# Patient Record
Sex: Male | Born: 1950 | Race: White | Hispanic: No | Marital: Married | Smoking: Former smoker
Health system: Southern US, Community
[De-identification: ages and names within clinical notes are randomized; demographics above are authoritative.]

## PROBLEM LIST (undated history)

## (undated) DIAGNOSIS — I2510 Atherosclerotic heart disease of native coronary artery without angina pectoris: Secondary | ICD-10-CM

## (undated) DIAGNOSIS — I219 Acute myocardial infarction, unspecified: Secondary | ICD-10-CM

## (undated) DIAGNOSIS — T8859XA Other complications of anesthesia, initial encounter: Secondary | ICD-10-CM

## (undated) DIAGNOSIS — I10 Essential (primary) hypertension: Secondary | ICD-10-CM

## (undated) DIAGNOSIS — T4145XA Adverse effect of unspecified anesthetic, initial encounter: Secondary | ICD-10-CM

## (undated) HISTORY — PX: KNEE SURGERY: SHX244

## (undated) HISTORY — PX: APPENDECTOMY: SHX54

## (undated) HISTORY — PX: HERNIA REPAIR: SHX51

## (undated) HISTORY — PX: COLON SURGERY: SHX602

## (undated) HISTORY — PX: CATARACT EXTRACTION: SUR2

## (undated) HISTORY — PX: BACK SURGERY: SHX140

## (undated) HISTORY — PX: OTHER SURGICAL HISTORY: SHX169

---

## 2003-01-25 ENCOUNTER — Ambulatory Visit (HOSPITAL_COMMUNITY): Admission: RE | Admit: 2003-01-25 | Discharge: 2003-01-26 | Payer: Self-pay | Admitting: Ophthalmology

## 2007-08-17 ENCOUNTER — Ambulatory Visit (HOSPITAL_COMMUNITY): Admission: RE | Admit: 2007-08-17 | Discharge: 2007-08-18 | Payer: Self-pay | Admitting: Ophthalmology

## 2011-05-13 NOTE — Op Note (Signed)
[name], [name]               ACCOUNT NO.:  [id number]   MEDICAL RECORD NO.:  [id number]          PATIENT TYPE:  AMB   LOCATION:  SDS                          FACILITY:  [facility]   PHYSICIAN:  John D. Ashley Royalty, M.D. DATE OF BIRTH:  August 21, 1951   DATE OF PROCEDURE:  08/17/2007  DATE OF DISCHARGE:                               OPERATIVE REPORT   ADMISSION DIAGNOSIS:  Rhegmatogenous retinal detachment, retained lens  material in the vitreous left eye.   PROCEDURE:  Scleral buckle with 25 gauge pars plana vitrectomy to repair  complex retinal detachment.  Retinal photocoagulation and capsulectomy  were performed as well.   SURGEON:  Beulah Gandy. Ashley Royalty, M.D.   ASSISTANT:  Bryan Lemma. Lundquist, P.A.   ANESTHESIA:  General.   DETAILS:  Usual prep and drape.  A 360-degree limbal peritomy.  Isolation of four rectus muscles on 2-0 silk.  Scleral dissection from 3  o'clock around to 2 o'clock to admit a #279 intrascleral implant.  Diathermy placed in the bed.  The #279 implant placed, two sutures per  quadrant, for a total of eight scleral sutures were placed in the  scleral flaps.  508G radial segment was placed beneath the break at 9  o'clock.  Perforation site was chosen in the in the bed at 9 o'clock  after scleral incision and diathermy.  The choroid was punctured with a  blunt needle.  A moderate amount of clear colorless retinal fluid came  forth.  Once the fluid stopped. the radial 508G segment was placed in  this area against the globe.  Scleral flaps were closed.  A 240 band was  placed around the eye with a 270 sleeve at 4 o'clock.  Indirect  ophthalmoscopy showed the retina to be lying nicely on the scleral  buckle, with minimal subretinal fluid remaining.  The indirect  ophthalmoscope laser was moved into place, and 720 burns were placed  around the retinal periphery and around the retinal break on the scleral  buckle.  The power was between 500-660 milliwatts, 1000 microns  each,  and 0.1 seconds each.  The band was adjusted to a proper indentation of  the globe.  25-gauge trocars were placed at 2 o'clock and 4 o'clock. The  buckle flaps were closed, and the scleral sutures were knotted.  The  band was adjusted and trimmed.  The buckle was adjusted and trimmed.  A  pars plana vitrectomy was begun just behind the pseudophakos. Retained  lens material was seen between 9 o'clock and 12 o'clock in the posterior  chamber of the eye.  This material was carefully removed under low  suction and rapid cutting with a 25 gauge cutter.  A 25-gauge infusion  was placed at 4 o'clock.  The vitrectomy was carried posteriorly until  all vitreous fragments were removed.  The instruments were removed from  the eye.  The trocars were removed from the eye.  They were tested and  found to be tight.  The conjunctiva was reposited with 7-0 chromic  suture.  Polymyxin and gentamicin were irrigated into Tenon's space.  Marcaine was  injected around the globe for postop pain.  Decadron 10 mg  was injected into the lower subconjunctival space.  Indirect  ophthalmoscopy showed the retina to be lying nicely on the scleral  buckle for 360 degrees.  TobraDex ophthalmic ointment and a patch and  shield were placed.  Closing pressure was 21 with a Baer keratometer.   COMPLICATIONS:  None.   DURATION:  1 hour 45 minutes.   The patient was awakened and taken to recovery in satisfactory  condition.   FINAL DIAGNOSIS:  Complex retinal detachment left eye.      Beulah Gandy. Ashley Royalty, M.D.  Electronically Signed     JDM/MEDQ  D:  08/17/2007  T:  08/18/2007  Job:  [id number]

## 2011-05-16 NOTE — Op Note (Signed)
NAME:  [name], CRUTCHFIELD                         ACCOUNT NO.:  [id number]   MEDICAL RECORD NO.:  [id number]                   PATIENT TYPE:  OIB   LOCATION:  2864                                 FACILITY:  [facility]   PHYSICIAN:  Beulah Gandy. Ashley Royalty, M.D.              DATE OF BIRTH:  1951/07/08   DATE OF PROCEDURE:  01/25/2003  DATE OF DISCHARGE:                                 OPERATIVE REPORT   PREOPERATIVE DIAGNOSIS:  Rhegmatogenous retinal detachment and vitreous  hemorrhage, right eye.   POSTOPERATIVE DIAGNOSIS:   OPERATION PERFORMED:  1. Scleral buckle, pars plana vitrectomy, right eye.  2. Gas-fluid exchange, right eye.  3. Retina photocoagulation, right eye.  4. Perfluoropropane injection, right eye.   SURGEON:  Beulah Gandy. Ashley Royalty, M.D.   ASSISTANT:  Ruthann Cancer   ANESTHESIA:  General.   DETAILS OF OPERATION:  Usual prep and drape.  Three-hundred 360-degree  limbal peritomy.  Isolation of four rectus muscles on 2-0 silk.  Localization of breaks at 9 and 11 o'clock.  Scleral dissection from 8  o'clock to 2 o'clock to admit a #279 intrascleral implant.  Additional  posterior dissection was carried out at 11 o'clock beneath the break.  Diathermy was placed in the bed and two sutures per quadrant were placed in  the scleral flaps for a total four scleral sutures.  A 240 band was placed  around the eye with a belt loupe at 3 o'clock and 5 o'clock.  The 279  implant was placed against the globe and a 508G radial segment was placed  beneath the break at 11 o'clock.   Once the elements were placed the pars plana vitrectomy was begun.  A 4 mm  infusion port was anchored into place at 8 o'clock.  The sclerotomies were  placed at 10 and 2 o'clock respectively.  Contact lens ring  was anchored  into place at 6 and 12 o'clock.  Methylcellulose was placed on the cornea  and the flap contact lens was placed.  A core vitrectomy was carried out  removing blood from the vitreous cavity and  blood was vacuumed from the  surface of the retina.  A stream of blood was seen coming down from the  retinal break.   The endolaser was positioned in the eye and the blood vessel was cauterized.  It shriveled up beneath the laser burns.  The bleeding stopped immediately.  Two-hundred-seventy-nine burns were placed in this fashion with a power of  1,000 milliwatts, 1,000 microns each and 0.5 seconds each.  The pars plana  vitrectomy was continued for 360 degrees down to the vitreous base where all  blood and vitreous were removed.  The scleral depression was used for  optimum viewing and a 30 degree prismatic lens was used.   Once this was accomplished the scleral flaps were closed securely with the  proper conjunctival indentation of the globe.  The band  was adjusted and  trimmed.  The scleral sutures were knotted and trimmed.  The 508G segment  was located perfectly beneath the break.   Perfluoropropane was prepared in 18% concentration.  Approximately 2 mL was  injected into the vitreous cavity for about a 40% vitreous cavity fill with  18% gas.   The instruments were removed from the eye and 9-0 nylon was used to close  the sclerotic sites.  The sites were tested and found to be tight.  The  conjunctiva was reposited with 7-0 chromic suture.  Polymyxin and gentamicin  were irrigated into Tenon's space.  Atropine solution was applied.  Decadron  was injected into the lower subconjunctival space.   The indirect ophthalmoscope laser was moved into place and additional laser  was placed around the break at 9 o'clock and on the scleral buckle.  Two-  hundred-seventy-six burns were placed with a power of 1,000 milliwatts each,  size 1,000 microns each and 0.05 second.  The closing tension was 10 mg per  tonometer, Polysporin, a patch and shield were placed.   The patient was awakened and taken to recovery in satisfactory condition.                                                  Beulah Gandy. Ashley Royalty, M.D.    JDM/MEDQ  D:  01/25/2003  T:  01/25/2003  Job:  [id number]

## 2011-06-30 ENCOUNTER — Other Ambulatory Visit (HOSPITAL_COMMUNITY): Payer: Self-pay | Admitting: Surgery

## 2011-06-30 DIAGNOSIS — K432 Incisional hernia without obstruction or gangrene: Secondary | ICD-10-CM

## 2011-07-07 ENCOUNTER — Other Ambulatory Visit (HOSPITAL_COMMUNITY): Payer: Self-pay

## 2011-07-09 ENCOUNTER — Ambulatory Visit (HOSPITAL_COMMUNITY)
Admission: RE | Admit: 2011-07-09 | Discharge: 2011-07-09 | Disposition: A | Discharge status: Home / Self Care | Payer: PRIVATE HEALTH INSURANCE | Source: Ambulatory Visit | Attending: Surgery | Admitting: Surgery

## 2011-07-09 DIAGNOSIS — K5730 Diverticulosis of large intestine without perforation or abscess without bleeding: Secondary | ICD-10-CM | POA: Insufficient documentation

## 2011-07-09 DIAGNOSIS — K432 Incisional hernia without obstruction or gangrene: Secondary | ICD-10-CM | POA: Insufficient documentation

## 2011-07-09 DIAGNOSIS — R109 Unspecified abdominal pain: Secondary | ICD-10-CM | POA: Insufficient documentation

## 2011-07-10 MED ORDER — IOHEXOL 300 MG/ML  SOLN
100.0000 mL | Freq: Once | INTRAMUSCULAR | Status: AC | PRN
  Administered 2011-07-10: 100 mL via INTRAVENOUS

## 2011-10-10 LAB — BASIC METABOLIC PANEL
GFR calc non Af Amer: >60
Potassium: 4.7
Sodium: 140

## 2011-10-10 LAB — CBC
HCT: 42.8
Hemoglobin: 14.6
Platelets: 203
WBC: 7.1

## 2012-03-10 ENCOUNTER — Encounter (HOSPITAL_BASED_OUTPATIENT_CLINIC_OR_DEPARTMENT_OTHER): Payer: Self-pay

## 2012-03-10 ENCOUNTER — Emergency Department (HOSPITAL_BASED_OUTPATIENT_CLINIC_OR_DEPARTMENT_OTHER)
Admission: EM | Admit: 2012-03-10 | Discharge: 2012-03-10 | Disposition: A | Discharge status: Home / Self Care | Payer: BC Managed Care – PPO | Attending: Emergency Medicine | Admitting: Emergency Medicine

## 2012-03-10 DIAGNOSIS — Y92009 Unspecified place in unspecified non-institutional (private) residence as the place of occurrence of the external cause: Secondary | ICD-10-CM | POA: Insufficient documentation

## 2012-03-10 DIAGNOSIS — Z9089 Acquired absence of other organs: Secondary | ICD-10-CM | POA: Insufficient documentation

## 2012-03-10 DIAGNOSIS — W260XXA Contact with knife, initial encounter: Secondary | ICD-10-CM | POA: Insufficient documentation

## 2012-03-10 DIAGNOSIS — Z79899 Other long term (current) drug therapy: Secondary | ICD-10-CM | POA: Insufficient documentation

## 2012-03-10 DIAGNOSIS — Z9861 Coronary angioplasty status: Secondary | ICD-10-CM | POA: Insufficient documentation

## 2012-03-10 DIAGNOSIS — S61219A Laceration without foreign body of unspecified finger without damage to nail, initial encounter: Secondary | ICD-10-CM

## 2012-03-10 DIAGNOSIS — S61209A Unspecified open wound of unspecified finger without damage to nail, initial encounter: Secondary | ICD-10-CM | POA: Insufficient documentation

## 2012-03-10 DIAGNOSIS — Z7982 Long term (current) use of aspirin: Secondary | ICD-10-CM | POA: Insufficient documentation

## 2012-03-10 DIAGNOSIS — I10 Essential (primary) hypertension: Secondary | ICD-10-CM | POA: Insufficient documentation

## 2012-03-10 DIAGNOSIS — W261XXA Contact with sword or dagger, initial encounter: Secondary | ICD-10-CM | POA: Insufficient documentation

## 2012-03-10 HISTORY — DX: Essential (primary) hypertension: I10

## 2012-03-10 MED ORDER — LIDOCAINE HCL (PF) 1 % IJ SOLN
5.0000 mL | Freq: Once | INTRAMUSCULAR | Status: AC
  Administered 2012-03-10: 5 mL via INTRADERMAL
  Filled 2012-03-10: qty 5

## 2012-03-10 NOTE — ED Notes (Signed)
Cut left middle finger on pocket knife 30 min PTA

## 2012-03-10 NOTE — ED Provider Notes (Signed)
History     CSN: [id number]  Arrival date & time 03/10/12  8657   First MD Initiated Contact with Patient 03/10/12 2013      Chief Complaint  Patient presents with  . Finger Injury    (Consider location/radiation/quality/duration/timing/severity/associated sxs/prior treatment) Patient is a 61 y.o. male presenting with hand injury. The history is provided by the patient. No language interpreter was used.  Hand Injury  The incident occurred 2 days ago. The incident occurred at home. There was no injury mechanism. The pain is present in the left fingers. The quality of the pain is described as aching. The pain is at a severity of 5/10. The pain is moderate. The pain has been constant since the incident. He reports no foreign bodies present. The symptoms are aggravated by movement. He has tried nothing for the symptoms.  Pt complains that he cut her finger with a pocket knife.    Past Medical History  Diagnosis Date  . Diverticula of colon   . Hypertension     Past Surgical History  Procedure Date  . Cardiac stent   . Colon surgery   . Hernia repair   . Back surgery   . Cataract extraction   . Knee surgery   . Appendectomy     No family history on file.  History  Substance Use Topics  . Smoking status: Never Smoker   . Smokeless tobacco: Not on file  . Alcohol Use: Yes      Review of Systems  Skin: Positive for wound.  All other systems reviewed and are negative.    Allergies  Review of patient's allergies indicates no known allergies.  Home Medications   Current Outpatient Rx  Name Route Sig Dispense Refill  . ASPIRIN 81 MG PO TABS Oral Take 81 mg by mouth daily.    . ENALAPRIL MALEATE 20 MG PO TABS Oral Take 20 mg by mouth daily.    Marland Kitchen EZETIMIBE 10 MG PO TABS Oral Take 10 mg by mouth daily.    Marland Kitchen METOPROLOL SUCCINATE ER 25 MG PO TB24 Oral Take 25 mg by mouth daily. Patient takes 12.5 milligrams of this medication.    Marland Kitchen NIACIN 500 MG PO TABS Oral Take 500  mg by mouth daily with breakfast.    . PRAVASTATIN SODIUM 80 MG PO TABS Oral Take 80 mg by mouth daily.      BP 137/75  Pulse 72  Temp(Src) 98.4 F (36.9 C) (Oral)  Resp 18  Ht 6\' 1"  (1.854 m)  Wt 224 lb (101.606 kg)  BMI 29.55 kg/m2  SpO2 98%  Physical Exam  Nursing note and vitals reviewed. Constitutional: He is oriented to person, place, and time. He appears well-developed and well-nourished.  HENT:  Head: Normocephalic.  Musculoskeletal: He exhibits tenderness.       5mm laceration dital aspect of left middle finger  Neurological: He is alert and oriented to person, place, and time.  Skin: Skin is warm.  Psychiatric: He has a normal mood and affect.    ED Course  LACERATION REPAIR Date/Time: 03/10/2012 9:03 PM Performed by: Elson Areas Authorized by: Elson Areas Consent: Verbal consent obtained. Time out: Immediately prior to procedure a "time out" was called to verify the correct patient, procedure, equipment, support staff and site/side marked as required. Laceration length: 0.5 cm Foreign bodies: no foreign bodies Tendon involvement: none Anesthesia: local infiltration Preparation: Patient was prepped and draped in the usual sterile fashion. Skin closure: glue  Patient tolerance: Patient tolerated the procedure well with no immediate complications.   (including critical care time)  Labs Reviewed - No data to display No results found.   1. Laceration of finger       MDM  Pt placed in splint to protect.       Lonia Skinner [place], [place] 03/10/12 2104

## 2012-03-10 NOTE — Discharge Instructions (Signed)
Tissue Adhesive Wound Care A wound can be repaired by using tissue adhesive. Tissue adhesive holds the skin together and allows faster healing. It forms a strong bond on the skin in about 1 minute and reaches its full strength in about 2 or 3 minutes. The adhesive disappears naturally while healing. Follow up is required if your caregiver wants to recheck for infection and to make sure your wound is healing properly.  You may need a tetanus shot if:  You cannot remember when you had your last tetanus shot.   You have never had a tetanus shot.   The injury broke your skin.  If you got a tetanus shot, your arm may swell, get red, and feel warm to the touch. This is common and not a problem. If you need a tetanus shot and you choose not to have one, there is a rare chance of getting tetanus. Sickness from tetanus can be serious. HOME CARE INSTRUCTIONS   Only take over-the-counter or prescription medicines for pain, discomfort, or fever as directed by your caregiver.   Showers are allowed. Do not soak the area containing the tissue adhesive. Do not take baths, swim, or use hot tubs. Do not use any soaps or ointments on the wound until it has healed.   If a bandage (dressing) has been applied, follow your caregiver's instructions for how often to change the dressing.   Keep the dressing dry if one has been applied.   Do not scratch, pick, or rub the adhesive.   Do not place tape over the adhesive. The adhesive could come off when pulling the tape off.   Protect the wound from further injury until it is healed.   Protect the wound from sun and tanning bed exposure while it is healing and for several weeks after healing.   Keep all follow-up appointments as directed by your caregiver.  SEEK IMMEDIATE MEDICAL CARE IF:   Your wound becomes red, swollen, hot, or tender.   You have increasing pain in the wound.   You have a red streak that goes away from the wound.   You have pus coming  from the wound.   You have a fever.   You have shaking chills.   There is a bad smell coming from the wound.   The wound or adhesive breaks open.  MAKE SURE YOU:   Understand these instructions.   Will watch your condition.   Will get help right away if you are not doing well or get worse.  Document Released: 06/10/2001 Document Revised: 12/04/2011 Document Reviewed: 04/20/2011 [employer] Patient Information 2012 [employer]. 

## 2012-03-10 NOTE — ED Provider Notes (Signed)
Medical screening examination/treatment/procedure(s) were performed by non-physician practitioner and as supervising physician I was immediately available for consultation/collaboration.  Ethelda Chick, MD 03/10/12 [phone]

## 2015-04-18 ENCOUNTER — Encounter (HOSPITAL_COMMUNITY): Payer: Self-pay | Admitting: *Deleted

## 2015-04-18 ENCOUNTER — Observation Stay (HOSPITAL_COMMUNITY)
Admission: EM | Admit: 2015-04-18 | Discharge: 2015-04-19 | Disposition: A | Discharge status: Home / Self Care | Payer: BLUE CROSS/BLUE SHIELD | Attending: Family Medicine | Admitting: Family Medicine

## 2015-04-18 ENCOUNTER — Emergency Department (HOSPITAL_COMMUNITY): Payer: BLUE CROSS/BLUE SHIELD

## 2015-04-18 DIAGNOSIS — Z79899 Other long term (current) drug therapy: Secondary | ICD-10-CM | POA: Insufficient documentation

## 2015-04-18 DIAGNOSIS — I10 Essential (primary) hypertension: Secondary | ICD-10-CM | POA: Diagnosis not present

## 2015-04-18 DIAGNOSIS — E780 Pure hypercholesterolemia: Secondary | ICD-10-CM | POA: Insufficient documentation

## 2015-04-18 DIAGNOSIS — R072 Precordial pain: Secondary | ICD-10-CM | POA: Diagnosis not present

## 2015-04-18 DIAGNOSIS — R61 Generalized hyperhidrosis: Secondary | ICD-10-CM | POA: Insufficient documentation

## 2015-04-18 DIAGNOSIS — R110 Nausea: Secondary | ICD-10-CM | POA: Diagnosis not present

## 2015-04-18 DIAGNOSIS — Z8719 Personal history of other diseases of the digestive system: Secondary | ICD-10-CM | POA: Insufficient documentation

## 2015-04-18 DIAGNOSIS — R001 Bradycardia, unspecified: Secondary | ICD-10-CM | POA: Insufficient documentation

## 2015-04-18 DIAGNOSIS — E785 Hyperlipidemia, unspecified: Secondary | ICD-10-CM | POA: Insufficient documentation

## 2015-04-18 DIAGNOSIS — I2510 Atherosclerotic heart disease of native coronary artery without angina pectoris: Secondary | ICD-10-CM | POA: Insufficient documentation

## 2015-04-18 DIAGNOSIS — E039 Hypothyroidism, unspecified: Secondary | ICD-10-CM | POA: Diagnosis not present

## 2015-04-18 DIAGNOSIS — Z9861 Coronary angioplasty status: Secondary | ICD-10-CM | POA: Diagnosis not present

## 2015-04-18 DIAGNOSIS — R079 Chest pain, unspecified: Principal | ICD-10-CM | POA: Diagnosis present

## 2015-04-18 DIAGNOSIS — Z7982 Long term (current) use of aspirin: Secondary | ICD-10-CM | POA: Insufficient documentation

## 2015-04-18 HISTORY — DX: Atherosclerotic heart disease of native coronary artery without angina pectoris: I25.10

## 2015-04-18 HISTORY — DX: Acute myocardial infarction, unspecified: I21.9

## 2015-04-18 HISTORY — DX: Other complications of anesthesia, initial encounter: T88.59XA

## 2015-04-18 HISTORY — DX: Adverse effect of unspecified anesthetic, initial encounter: T41.45XA

## 2015-04-18 LAB — CBC WITH DIFFERENTIAL/PLATELET
BASOS PCT: 0 % (ref 0–1)
Basophils Absolute: 0 10*3/uL (ref 0.0–0.1)
EOS ABS: 0.2 10*3/uL (ref 0.0–0.7)
Eosinophils Relative: 3 % (ref 0–5)
HCT: 42.6 % (ref 39.0–52.0)
HEMOGLOBIN: 14.2 g/dL (ref 13.0–17.0)
LYMPHS ABS: 2.3 10*3/uL (ref 0.7–4.0)
LYMPHS PCT: 29 % (ref 12–46)
MCH: 30.4 pg (ref 26.0–34.0)
MCHC: 33.3 g/dL (ref 30.0–36.0)
MCV: 91.2 fL (ref 78.0–100.0)
MONOS PCT: 7 % (ref 3–12)
Monocytes Absolute: 0.6 10*3/uL (ref 0.1–1.0)
NEUTROS ABS: 4.8 10*3/uL (ref 1.7–7.7)
NEUTROS PCT: 61 % (ref 43–77)
PLATELETS: 202 10*3/uL (ref 150–400)
RBC: 4.67 MIL/uL (ref 4.22–5.81)
RDW: 12.9 % (ref 11.5–15.5)
WBC: 7.9 10*3/uL (ref 4.0–10.5)

## 2015-04-18 LAB — BASIC METABOLIC PANEL
ANION GAP: 8 (ref 5–15)
BUN: 18 mg/dL (ref 6–23)
CHLORIDE: 104 mmol/L (ref 96–112)
CO2: 25 mmol/L (ref 19–32)
Calcium: 8.9 mg/dL (ref 8.4–10.5)
Creatinine, Ser: 0.98 mg/dL (ref 0.50–1.35)
GFR calc non Af Amer: 86 mL/min — ABNORMAL LOW (ref >90)
Glucose, Bld: 98 mg/dL (ref 70–99)
POTASSIUM: 5 mmol/L (ref 3.5–5.1)
SODIUM: 137 mmol/L (ref 135–145)

## 2015-04-18 LAB — TROPONIN I

## 2015-04-18 MED ORDER — ENALAPRIL MALEATE 5 MG PO TABS
10.0000 mg | ORAL_TABLET | Freq: Every day | ORAL | Status: DC
  Filled 2015-04-18: qty 2

## 2015-04-18 MED ORDER — ACETAMINOPHEN 325 MG PO TABS
650.0000 mg | ORAL_TABLET | ORAL | Status: DC | PRN
Start: 2015-04-18 — End: 2015-04-19

## 2015-04-18 MED ORDER — METOPROLOL SUCCINATE ER 25 MG PO TB24
12.5000 mg | ORAL_TABLET | Freq: Every day | ORAL | Status: DC
  Filled 2015-04-18: qty 1

## 2015-04-18 MED ORDER — ACETAMINOPHEN 325 MG PO TABS
650.0000 mg | ORAL_TABLET | Freq: Once | ORAL | Status: AC
Start: 2015-04-18 — End: 2015-04-18
  Administered 2015-04-18: 650 mg via ORAL
  Filled 2015-04-18: qty 2

## 2015-04-18 MED ORDER — ONDANSETRON HCL 4 MG/2ML IJ SOLN: 4.0000 mg | Freq: Four times a day (QID) | INTRAMUSCULAR | Status: DC | PRN

## 2015-04-18 MED ORDER — ASPIRIN EC 81 MG PO TBEC
81.0000 mg | DELAYED_RELEASE_TABLET | Freq: Every day | ORAL | Status: DC
  Administered 2015-04-19: 81 mg via ORAL
  Filled 2015-04-18 (×2): qty 1

## 2015-04-18 MED ORDER — LEVOTHYROXINE SODIUM 75 MCG PO TABS
75.0000 ug | ORAL_TABLET | Freq: Every day | ORAL | Status: DC
  Administered 2015-04-19: 75 ug via ORAL
  Filled 2015-04-18: qty 1

## 2015-04-18 MED ORDER — POLYETHYLENE GLYCOL 3350 17 G PO PACK: 17.0000 g | PACK | Freq: Every day | ORAL | Status: DC

## 2015-04-18 MED ORDER — PRAVASTATIN SODIUM 40 MG PO TABS
80.0000 mg | ORAL_TABLET | Freq: Every day | ORAL | Status: DC
  Filled 2015-04-18: qty 2

## 2015-04-18 MED ORDER — HEPARIN SODIUM (PORCINE) 5000 UNIT/ML IJ SOLN: 5000.0000 [IU] | Freq: Three times a day (TID) | INTRAMUSCULAR | Status: DC

## 2015-04-18 NOTE — ED Notes (Signed)
Appointment at 2115

## 2015-04-18 NOTE — ED Provider Notes (Signed)
CSN: [id number]     Arrival date & time 04/18/15  1748 History   First MD Initiated Contact with Patient 04/18/15 1750     Chief Complaint  Patient presents with  . Chest Pain   (Consider location/radiation/quality/duration/timing/severity/associated sxs/prior Treatment) Patient is a 64 y.o. male presenting with chest pain. The history is provided by the patient. No language interpreter was used.  Chest Pain Pain location:  Substernal area Pain quality: pressure   Pain radiates to:  Does not radiate Pain radiates to the back: no   Pain severity:  Moderate Onset quality:  Gradual Timing:  Constant Progression:  Resolved Chronicity:  New Context comment:  After exercise Relieved by:  Nitroglycerin and aspirin Worsened by:  Nothing tried Ineffective treatments:  None tried Associated symptoms: diaphoresis and nausea   Associated symptoms: no abdominal pain, no altered mental status, no cough, no dizziness, no fatigue, no fever, no headache, no numbness, no palpitations, no shortness of breath, no syncope, not vomiting and no weakness   Risk factors: coronary artery disease, high cholesterol, hypertension and male sex   Risk factors: not obese, no prior DVT/PE and no smoking     Past Medical History  Diagnosis Date  . Diverticula of colon   . Hypertension    Past Surgical History  Procedure Laterality Date  . Cardiac stent    . Colon surgery    . Hernia repair    . Back surgery    . Cataract extraction    . Knee surgery    . Appendectomy     History reviewed. No pertinent family history. History  Substance Use Topics  . Smoking status: Never Smoker   . Smokeless tobacco: Not on file  . Alcohol Use: Yes    Review of Systems  Constitutional: Positive for diaphoresis. Negative for fever and fatigue.  Respiratory: Negative for cough, chest tightness and shortness of breath.   Cardiovascular: Positive for chest pain. Negative for palpitations, leg swelling and syncope.   Gastrointestinal: Positive for nausea. Negative for vomiting, abdominal pain and constipation.  Musculoskeletal: Negative for gait problem.  Neurological: Negative for dizziness, syncope, weakness, light-headedness, numbness and headaches.  Psychiatric/Behavioral: Negative for confusion.  All other systems reviewed and are negative.     Allergies  Review of patient's allergies indicates no known allergies.  Home Medications   Prior to Admission medications   Medication Sig Start Date End Date Taking? Authorizing Provider  aspirin 81 MG tablet Take 81 mg by mouth daily.    Historical Provider, MD  enalapril (VASOTEC) 20 MG tablet Take 20 mg by mouth daily.    Historical Provider, MD  ezetimibe (ZETIA) 10 MG tablet Take 10 mg by mouth daily.    Historical Provider, MD  metoprolol succinate (TOPROL-XL) 25 MG 24 hr tablet Take 25 mg by mouth daily. Patient takes 12.5 milligrams of this medication.    Historical Provider, MD  niacin 500 MG tablet Take 500 mg by mouth daily with breakfast.    Historical Provider, MD  pravastatin (PRAVACHOL) 80 MG tablet Take 80 mg by mouth daily.    Historical Provider, MD    ED Triage Vitals  Enc Vitals Group     BP 04/18/15 1805 124/70 mmHg     Pulse Rate 04/18/15 1805 57     Resp 04/18/15 1805 16     Temp 04/18/15 1801 98 F (36.7 C)     Temp Source 04/18/15 1801 Oral     SpO2 04/18/15 1753 98 %  Weight 04/18/15 1801 240 lb (108.863 kg)     Height 04/18/15 1801 '6\' 1"'$  (1.854 m)     Head Cir --      Peak Flow --      Pain Score 04/18/15 1802 0     Pain Loc --      Pain Edu? --      Excl. in [place]? --    Physical Exam  Constitutional: He is oriented to person, place, and time. He appears well-developed and well-nourished. He is active. He does not appear ill. No distress.  HENT:  Head: Normocephalic and atraumatic.  Nose: Nose normal.  Mouth/Throat: Oropharynx is clear and moist. No oropharyngeal exudate.  Eyes: EOM are normal. Pupils are  equal, round, and reactive to light.  Neck: Normal range of motion. Neck supple.  Cardiovascular: Regular rhythm, normal heart sounds and intact distal pulses.  Bradycardia present.   No murmur heard. Pulmonary/Chest: Effort normal and breath sounds normal. No respiratory distress. He has no wheezes. He exhibits no tenderness.  Abdominal: Soft. He exhibits no distension. There is no tenderness. There is no guarding.  Musculoskeletal: Normal range of motion. He exhibits no tenderness.  Neurological: He is alert and oriented to person, place, and time. No cranial nerve deficit. Coordination normal.  Skin: Skin is warm and dry. He is not diaphoretic. No pallor.  Psychiatric: He has a normal mood and affect. His behavior is normal. Judgment and thought content normal.  Nursing note and vitals reviewed.   ED Course  Procedures (including critical care time) Labs Review Labs Reviewed  BASIC METABOLIC PANEL - Abnormal; Notable for the following:    GFR calc non Af Amer 86 (*)    All other components within normal limits  CBC WITH DIFFERENTIAL/PLATELET  TROPONIN I  TROPONIN I  TROPONIN I  TROPONIN I  HEMOGLOBIN A1C  LIPID PANEL  TSH    Imaging Review Dg Chest 2 View  04/18/2015   CLINICAL DATA:  Chest pain  EXAM: CHEST  2 VIEW  COMPARISON:  None.  FINDINGS: The heart size and mediastinal contours are within normal limits. Both lungs are clear. The visualized skeletal structures are unremarkable.  IMPRESSION: No active cardiopulmonary disease.   Electronically Signed   By: Inez Catalina M.D.   On: 04/18/2015 20:34     EKG Interpretation   Date/Time:  Wednesday April 18 2015 18:01:53 EDT Ventricular Rate:  54 PR Interval:  145 QRS Duration: 99 QT Interval:  430 QTC Calculation: 407 R Axis:   39 Text Interpretation:  Sinus rhythm No significant change was found  Confirmed by [facility]  MD, TREY ([id number]) on 04/18/2015 6:23:25 PM      MDM   Final diagnoses:  Chest pain, unspecified  chest pain type   Pt is a 64 yo M with hx of HTN, HLD, and CAD with stents who presents with chest pain.  Was working outside in the yard clearing up brush all afternoon, then developed substernal chest pressure/pain sensation.  Associated slight diaphoresis and nausea.  Went to the local fire station and had vitals checked, then EMS called.  Given ASA 325 and NTG x 1 by EMS and pain is totally resolved now.  EKG: sinus brady at 52 bpm, no ST elevations/depressions, no T wave abnormalities  Vitals benign except for mild bradycarida.  No reproducible chest pain.  Normal work of breathing.  No abd pain.   Trop negative  Will need admission for high risk ACS rule out.  Doesn't have an active cardiologist.  Triaged to unassigned medicine teams and will be admitted to family medicine teaching service for ACS rule out. Stable for the floor.   Patient was seen with ED Attending, Dr. Gwynneth Aliment, MD   Tori Milks, MD 04/19/15 [place], MD 04/19/15 1544

## 2015-04-18 NOTE — ED Notes (Signed)
Pt arrives from home via [facility]. Pt states he was doing yard work and suddenly experienced substernal chest pressure at 8/10. Pt cp began resolving on its own en route and was relieved entirely by 1 nitro. Pt states he has "been feeling weird" x 2 days, but is unable to elaborate on how. 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

## 2015-04-18 NOTE — H&P (Signed)
[facility] Admission History and Physical Service Pager: [phone]  Patient name: [name] Medical record number: [id number] Date of birth: 04/09/1951 Age: 64 y.o. Gender: male  Primary Care Provider: No primary care provider on file. Consultants: None Code Status: Full  Chief Complaint: Chest pain  Assessment and Plan: [name] is a 64 y.o. male presenting with Chest pain. PMH is significant for CAD s/p MI and 3 stents placed in 2008, hypothyroidism, HTN, HLD.  CAD / ACS r/o. Heart score of 4 on admission. Work up thus far unremarkable. Possibly musculoskeletal in etiology, though concern for cardiac etiology given history. Patient with history of CAD with prior MI and stenting in 2008. Has not seen cardiologist in several years. - EKG with sinus brady and no ischemic changes, troponin negative X 1  - Continue home ASA, pravastatin, and metoprolol  - Trend troponins x3 - EKG in AM - Risk stratification labs: TSH, A1c, lipid panel - Consult cardiology in AM - Consider stress testing as outpatient if above workup negative.   HTN. At goal. - Continue home enalapril  Hypothyroidism - f/u TSH - Continue home synthroid  FEN/GI: Heart healthy diet, SLIV Prophylaxis: SubQ heparin  Disposition: Admitted to telemetry under attending Dr McDiarmid pending above work up  History of Present Illness: [name] is a 64 y.o. male presenting with chest pain.  Patient reports that he was in his usual state of health until this evening when he noticed a "discomfort" in his chest while cutting wood near his home. Patient then went to the fire department that was located near his home and eventually called EMS to bring him to the ED. Patient started that the pain felt like a "tightness" and only lasted for 5-10 minutes. Patient has had similar pain in the past when he had a previous heart attack, except this time he did not have any shortness of  breath. He did have some associated diaphoresis.  Patient also reports a history of "fluttering" when he wakes up in the morning. He does not experience this during exertion. No dizziness or syncopal episodes.   Patient states that he was given four '81mg'$  ASA and a nitroglycerin tab by EMS. Currently states that his chest feels "sore" but otherwise pain has resolved.  In the ED, the patient patient had normal EKG and negative troponin. Will be admitted for ACS rule out.  Review Of Systems: Per HPI, otherwise 12 point review of systems was performed and was unremarkable.  Patient Active Problem List   Diagnosis Date Noted  . Chest pain 04/18/2015   Past Medical History: Past Medical History  Diagnosis Date  . Diverticula of colon   . Hypertension    Past Surgical History: Past Surgical History  Procedure Laterality Date  . Cardiac stent    . Colon surgery    . Hernia repair    . Back surgery    . Cataract extraction    . Knee surgery    . Appendectomy     Social History: History  Substance Use Topics  . Smoking status: Never Smoker   . Smokeless tobacco: Not on file  . Alcohol Use: Yes    Please also refer to relevant sections of EMR.  Family History: History reviewed. No pertinent family history. Allergies and Medications: No Known Allergies No current facility-administered medications on file prior to encounter.   Current Outpatient Prescriptions on File Prior to Encounter  Medication Sig Dispense Refill  .  aspirin 81 MG tablet Take 81 mg by mouth daily.    . enalapril (VASOTEC) 20 MG tablet Take 10 mg by mouth daily.     . metoprolol succinate (TOPROL-XL) 25 MG 24 hr tablet Take 25 mg by mouth daily. Patient takes 12.5 milligrams of this medication.    . pravastatin (PRAVACHOL) 80 MG tablet Take 80 mg by mouth daily.    . niacin 500 MG tablet Take 500 mg by mouth daily with breakfast.      Objective: BP 119/72 mmHg  Pulse 55  Temp(Src) 98.5 F (36.9 C)  (Oral)  Resp 14  Ht '6\' 1"'$  (1.854 m)  Wt 236 lb 3.2 oz (107.14 kg)  BMI 31.17 kg/m2  SpO2 96% Exam: General: Adult male lying in hospital bed in NAD HEENT: NCAT, EOMI, MMM Cardiovascular: Bradycardic, regular rhythm. No murmurs appreciated.  Respiratory: NWOB, CTAB with no wheezes or crackles Abdomen: soft, NT, ND, positive bowel sounds Extremities: No cyanosis or edema Skin: WWP, no rashes or skin breakdown Neuro: Alert and conversational. No focal neurological deficits.   Labs and Imaging: CBC BMET   Recent Labs Lab 04/18/15 1850  WBC 7.9  HGB 14.2  HCT 42.6  PLT 202    Recent Labs Lab 04/18/15 1850  NA 137  K 5.0  CL 104  CO2 25  BUN 18  CREATININE 0.98  GLUCOSE 98  CALCIUM 8.9     Troponin <0.01 EKG: bradycardia (HR54), sinus, no acute ischemic changes  Dg Chest 2 View 04/18/2015    FINDINGS: The heart size and mediastinal contours are within normal limits. Both lungs are clear. The visualized skeletal structures are unremarkable.  IMPRESSION: No active cardiopulmonary disease.     Vivi Barrack, MD 04/18/2015, 10:08 PM PGY-1, [place] Intern pager: [phone], text pages welcome  I have seen and examined the patient with Dr. Jerline Pain and I agree with his documentation above. My annotations are in blue.   Laroy Apple, MD [place] Resident, PGY-3 04/19/2015, 6:24 AM

## 2015-04-19 ENCOUNTER — Observation Stay (HOSPITAL_COMMUNITY): Payer: BLUE CROSS/BLUE SHIELD

## 2015-04-19 ENCOUNTER — Encounter (HOSPITAL_COMMUNITY): Payer: Self-pay | Admitting: General Practice

## 2015-04-19 DIAGNOSIS — R079 Chest pain, unspecified: Secondary | ICD-10-CM | POA: Insufficient documentation

## 2015-04-19 DIAGNOSIS — E785 Hyperlipidemia, unspecified: Secondary | ICD-10-CM | POA: Insufficient documentation

## 2015-04-19 DIAGNOSIS — I10 Essential (primary) hypertension: Secondary | ICD-10-CM | POA: Diagnosis not present

## 2015-04-19 DIAGNOSIS — R072 Precordial pain: Secondary | ICD-10-CM

## 2015-04-19 DIAGNOSIS — E039 Hypothyroidism, unspecified: Secondary | ICD-10-CM | POA: Insufficient documentation

## 2015-04-19 DIAGNOSIS — I2510 Atherosclerotic heart disease of native coronary artery without angina pectoris: Secondary | ICD-10-CM | POA: Diagnosis not present

## 2015-04-19 LAB — LIPID PANEL
Cholesterol: 160 mg/dL (ref 0–200)
HDL: 30 mg/dL — ABNORMAL LOW (ref >39)
LDL CALC: 89 mg/dL (ref 0–99)
Total CHOL/HDL Ratio: 5.3 RATIO
Triglycerides: 204 mg/dL — ABNORMAL HIGH (ref <150)
VLDL: 41 mg/dL — ABNORMAL HIGH (ref 0–40)

## 2015-04-19 LAB — TSH: TSH: 0.86 u[IU]/mL (ref 0.350–4.500)

## 2015-04-19 LAB — TROPONIN I: Troponin I: <0.03 ng/mL (ref <0.031)

## 2015-04-19 MED ORDER — TECHNETIUM TC 99M SESTAMIBI GENERIC - CARDIOLITE
10.0000 | Freq: Once | INTRAVENOUS | Status: AC | PRN
  Administered 2015-04-19: 10 via INTRAVENOUS

## 2015-04-19 MED ORDER — ENALAPRIL MALEATE 5 MG PO TABS
5.0000 mg | ORAL_TABLET | Freq: Every day | ORAL | Status: DC
  Filled 2015-04-19 (×2): qty 1

## 2015-04-19 MED ORDER — TECHNETIUM TC 99M SESTAMIBI - CARDIOLITE
30.0000 | Freq: Once | INTRAVENOUS | Status: AC | PRN
  Administered 2015-04-19: 30 via INTRAVENOUS

## 2015-04-19 NOTE — Consult Note (Signed)
CARDIOLOGY CONSULT NOTE   Patient ID: [name] MRN: [id number], DOB/AGE: 02-14-1951   Admit date: 04/18/2015 Date of Consult: 04/19/2015  Primary Physician: No primary care provider on file. Primary Cardiologist: Cardiologist i [place]  Reason for consult:  Chest pain  Problem List  Past Medical History  Diagnosis Date  . Diverticula of colon   . Hypertension   . Complication of anesthesia     DIFFICULTY WAKING  . Myocardial infarction   . Coronary artery disease     Past Surgical History  Procedure Laterality Date  . Cardiac stent    . Colon surgery    . Hernia repair    . Back surgery    . Cataract extraction    . Knee surgery    . Appendectomy       Allergies  No Known Allergies  HPI   [name] is a 64 y.o. male presenting with chest pain. H/o CAD, with PCI/ 3 stents to unknown vessels in 2008 [facility]).  Patient reports that he was in his usual state of health until this evening when he noticed a "discomfort" in his chest while cutting wood near his home, he has done the same strenuous work a week ago. This was different pain than pain associated with MI in 2008. Patient then went to the fire department that was located near his home and eventually called EMS to bring him to the ED. Patient started that the pain felt like a "tightness" and only lasted for 5-10 minutes. Patient has had similar pain in the past when he had a previous heart attack, except this time he did not have any shortness of breath. He did have some associated diaphoresis.  Patient also reports a history of "fluttering" when he wakes up in the morning. He does not experience this during exertion. No dizziness or syncopal episodes.   Patient states that he was given four '81mg'$  ASA and a nitroglycerin tab by EMS. Currently states that his chest feels "sore" but otherwise pain has resolved.  In the ED, the patient patient had normal EKG and negative troponin.  Will be admitted for ACS rule out.  Inpatient Medications  . aspirin EC  81 mg Oral Daily  . enalapril  10 mg Oral Daily  . heparin  5,000 Units Subcutaneous 3 times per day  . levothyroxine  75 mcg Oral QAC breakfast  . metoprolol succinate  12.5 mg Oral Daily  . polyethylene glycol  17 g Oral Daily  . pravastatin  80 mg Oral q1800    Family History History reviewed. No pertinent family history.   Social History History   Social History  . Marital Status: Married    Spouse Name: N/A  . Number of Children: N/A  . Years of Education: N/A   Occupational History  . Not on file.   Social History Main Topics  . Smoking status: Former Research scientist (life sciences)  . Smokeless tobacco: Never Used     Comment: quit smoking 25 years ago  . Alcohol Use: Yes  . Drug Use: No  . Sexual Activity: Not on file   Other Topics Concern  . Not on file   Social History Narrative     Review of Systems  General:  No chills, fever, night sweats or weight changes.  Cardiovascular:  No chest pain, dyspnea on exertion, edema, orthopnea, palpitations, paroxysmal nocturnal dyspnea. Dermatological: No rash, lesions/masses Respiratory: No cough, dyspnea Urologic: No hematuria, dysuria Abdominal:  No nausea, vomiting, diarrhea, bright red blood per rectum, melena, or hematemesis Neurologic:  No visual changes, wkns, changes in mental status. All other systems reviewed and are otherwise negative except as noted above.  Physical Exam  Blood pressure 125/71, pulse 54, temperature 97.9 F (36.6 C), temperature source Oral, resp. rate 17, height '6\' 1"'$  (1.854 m), weight 236 lb 3.2 oz (107.14 kg), SpO2 98 %.  General: Pleasant, NAD Psych: Normal affect. Neuro: Alert and oriented X 3. Moves all extremities spontaneously. HEENT: Normal  Neck: Supple without bruits or JVD. Lungs:  Resp regular and unlabored, CTA. Heart: RRR no s3, s4, or murmurs. Abdomen: Soft, non-tender, non-distended, BS + x 4.  Extremities: No  clubbing, cyanosis or edema. DP/PT/Radials 2+ and equal bilaterally.  Labs   Recent Labs  04/18/15 1850 04/19/15 0021 04/19/15 0406  TROPONINI <0.03 <0.03 <0.03   Lab Results  Component Value Date   WBC 7.9 04/18/2015   HGB 14.2 04/18/2015   HCT 42.6 04/18/2015   MCV 91.2 04/18/2015   PLT 202 04/18/2015    Recent Labs Lab 04/18/15 1850  NA 137  K 5.0  CL 104  CO2 25  BUN 18  CREATININE 0.98  CALCIUM 8.9  GLUCOSE 98   Lab Results  Component Value Date   CHOL 160 04/19/2015   HDL 30* 04/19/2015   LDLCALC 89 04/19/2015   TRIG 204* 04/19/2015   No results found for: DDIMER Invalid input(s): POCBNP  Radiology/Studies  Dg Chest 2 View  04/18/2015   CLINICAL DATA:  Chest pain  EXAM: CHEST  2 VIEW  COMPARISON:  None.  FINDINGS: The heart size and mediastinal contours are within normal limits. Both lungs are clear. The visualized skeletal structures are unremarkable.  IMPRESSION: No active cardiopulmonary disease.   Electronically Signed   By: Inez Catalina M.D.   On: 04/18/2015 20:34    Echocardiogram - none  ECG: SB, otherwise normal ECG   ASSESSMENT AND PLAN  64 year old male   1. Chest pain - seems to be musculoskeletal sec to repeated strenuous work, however he has h/o MI and PCI x 3 in 2008. We will order an exercise nuclear stress test to rule out ischemia. Troponi negative x 3, normal ECG. - continue asa 81 mg po daily, metoprolol, enalapril and pravastatin  2. Hypertension - at goal on current regimen  3. Hyperlipidemia - hight TG, he needs higher diet compliance   Discharge home if negative stress test.   Signed, Dorothy Spark, MD, [facility] 04/19/2015, 8:53 AM

## 2015-04-19 NOTE — Discharge Summary (Signed)
[facility] Discharge Summary  Patient name: [name] Medical record number: [id number] Date of birth: 02/21/1951 Age: 64 y.o. Gender: male Date of Admission: 04/18/2015  Date of Discharge: 04/19/2015 Admitting Physician: Zenia Resides, MD  Primary Care Provider: No primary care provider on file. Consultants: Cardiology  Indication for Hospitalization: Chest pain  Discharge Diagnoses/Problem List:  Chest pain, CAD s/p MI and stents x3, Hypothyroidism, HTN, HLD  Disposition: Home  Discharge Condition: Improved  Discharge Exam:  Blood pressure 129/76, pulse 66, temperature 98 F (36.7 C), temperature source Oral, resp. rate 18, height '6\' 1"'$  (1.854 m), weight 236 lb 3.2 oz (107.14 kg), SpO2 97 %. General: Adult male lying in hospital bed in NAD HEENT: NCAT, EOMI, MMM Cardiovascular: Bradycardic, regular rhythm. No murmurs appreciated. Tender to palpation over sternum. Respiratory: NWOB, CTAB with no wheezes or crackles Abdomen: soft, NT, ND, positive bowel sounds Extremities: No cyanosis or edema Skin: WWP, no rashes or skin breakdown Neuro: Alert and conversational. No focal neurological deficits.   Brief Hospital Course:  [name] is a 64 year old man who presented to the ED with sudden onset chest pain while clearing brush outside of his home. In the ED, he had an initial negative troponin and EKG, however given his significant cardiac history (history of MI and stents x3), he was admitted for ACS rule out. On admission, he was noted to have a "sore" chest which was reproducible on exam. We trended his troponin which was negative x3 and repeated his EKG which was negative. We consulted our cardiology team, who recommended patient undergo exercise stress test, which was negative for induced ischemia. Given his clinical picture and negative work up including exercise testing, the most likely etiology of his presentation was musculoskeletal  chest pain. He will be discharged in stable condition to have follow up with his PCP.   Issues for Follow Up:  1. Consider increasing statin therapy to high intensity  Significant Procedures: None  Significant Labs and Imaging:   Recent Labs Lab 04/18/15 1850  WBC 7.9  HGB 14.2  HCT 42.6  PLT 202    Recent Labs Lab 04/18/15 1850  NA 137  K 5.0  CL 104  CO2 25  GLUCOSE 98  BUN 18  CREATININE 0.98  CALCIUM 8.9      Recent Labs Lab 04/18/15 1850 04/19/15 0021 04/19/15 0406 04/19/15 1125  TROPONINI <0.03 <0.03 <0.03 <0.03   EKG: Sinus bradycardia, no ischemic changes Lipid panel: total cholesterol 160, TG 204, HDL 30, LDL 89 TSH: 0.860  EKG: bradycardia (HR54), sinus, no acute ischemic changes  Dg Chest 2 View 04/18/2015  FINDINGS: The heart size and mediastinal contours are within normal limits. Both lungs are clear. The visualized skeletal structures are unremarkable. IMPRESSION: No active cardiopulmonary disease.   Results/Tests Pending at Time of Discharge: None  Discharge Medications:    Medication List    TAKE these medications        aspirin 81 MG tablet  Take 81 mg by mouth daily.     enalapril 20 MG tablet  Commonly known as:  VASOTEC  Take 10 mg by mouth daily.     glucosamine-chondroitin 500-400 MG tablet  Take 1 tablet by mouth every morning.     levothyroxine 75 MCG tablet  Commonly known as:  SYNTHROID, LEVOTHROID  Take 75 mcg by mouth daily before breakfast.     metoprolol succinate 25 MG 24 hr tablet  Commonly known  as:  TOPROL-XL  Take 25 mg by mouth daily. Patient takes 12.5 milligrams of this medication.     niacin 500 MG tablet  Take 500 mg by mouth daily with breakfast.     polyethylene glycol packet  Commonly known as:  MIRALAX / GLYCOLAX  Take 17 g by mouth daily.     pravastatin 80 MG tablet  Commonly known as:  PRAVACHOL  Take 80 mg by mouth daily.     PROBIOTIC DAILY PO  Take 1 tablet by mouth daily.         Discharge Instructions: Please refer to Patient Instructions section of EMR for full details.  Patient was counseled important signs and symptoms that should prompt return to medical care, changes in medications, dietary instructions, activity restrictions, and follow up appointments.   Follow-Up Appointments: Follow-up Information    Schedule an appointment as soon as possible for a visit with Marijo File, MD.   Contact information:   [facility] [address]  [place], [place] [address]  [place] Fax: [phone]      Vivi Barrack, MD 04/20/2015, 12:10 PM PGY-1, [place]

## 2015-04-19 NOTE — Discharge Instructions (Signed)
Chest Pain Observation  It is often hard to give a specific diagnosis for the cause of chest pain. Among other possibilities your symptoms might be caused by inadequate oxygen delivery to your heart (angina). Angina that is not treated or evaluated can lead to a heart attack (myocardial infarction) or death.  Blood tests, electrocardiograms, and X-rays may have been done to help determine a possible cause of your chest pain. After evaluation and observation, your health care provider has determined that it is unlikely your pain was caused by an unstable condition that requires hospitalization. However, a full evaluation of your pain may need to be completed, with additional diagnostic testing as directed. It is very important to keep your follow-up appointments. Not keeping your follow-up appointments could result in permanent heart damage, disability, or death. If there is any problem keeping your follow-up appointments, you must call your health care provider.  HOME CARE INSTRUCTIONS   Due to the slight chance that your pain could be angina, it is important to follow your health care provider's treatment plan and also maintain a healthy lifestyle:  · Maintain or work toward achieving a healthy weight.  · Stay physically active and exercise regularly.  · Decrease your salt intake.  · Eat a balanced, healthy diet. Talk to a dietitian to learn about heart-healthy foods.  · Increase your fiber intake by including whole grains, vegetables, fruits, and nuts in your diet.  · Avoid situations that cause stress, anger, or depression.  · Take medicines as advised by your health care provider. Report any side effects to your health care provider. Do not stop medicines or adjust the dosages on your own.  · Quit smoking. Do not use nicotine patches or gum until you check with your health care provider.  · Keep your blood pressure, blood sugar, and cholesterol levels within normal limits.  · Limit alcohol intake to no more than  1 drink per day for women who are not pregnant and 2 drinks per day for men.  · Do not abuse drugs.  SEEK IMMEDIATE MEDICAL CARE IF:  You have severe chest pain or pressure which may include symptoms such as:  · You feel pain or pressure in your arms, neck, jaw, or back.  · You have severe back or abdominal pain, feel sick to your stomach (nauseous), or throw up (vomit).  · You are sweating profusely.  · You are having a fast or irregular heartbeat.  · You feel short of breath while at rest.  · You notice increasing shortness of breath during rest, sleep, or with activity.  · You have chest pain that does not get better after rest or after taking your usual medicine.  · You wake from sleep with chest pain.  · You are unable to sleep because you cannot breathe.  · You develop a frequent cough or you are coughing up blood.  · You feel dizzy, faint, or experience extreme fatigue.  · You develop severe weakness, dizziness, fainting, or chills.  Any of these symptoms may represent a serious problem that is an emergency. Do not wait to see if the symptoms will go away. Call your local emergency services (911 in the [place] Do not drive yourself to the hospital.  MAKE SURE YOU:  · Understand these instructions.  · Will watch your condition.  · Will get help right away if you are not doing well or get worse.  Document Released: 01/17/2011 Document Revised: 12/20/2013 Document Reviewed: 06/16/2013    ExitCare® Patient Information ©2015 [employer]. This information is not intended to replace advice given to you by your health care provider. Make sure you discuss any questions you have with your health care provider.

## 2015-04-19 NOTE — Progress Notes (Signed)
Ur COMPLETED.

## 2015-04-19 NOTE — Progress Notes (Signed)
Results of Stress test: no exercise induced ischemia, normal left ventricular wall motion, EF 58%, low risk stress test findings.  Results reviewed with Kerin Ransom PA with Cardiology, states test is negative.  Dr. Raoul Pitch with Family Practice paged and notified.  Plans to discharge patient.  Sanda Linger

## 2015-04-19 NOTE — Progress Notes (Signed)
Pt exercised 9 minutes of the Bruce. No significant EKG changes or C/P. Images pending. Top be read by radiologist.   Kerin Ransom PA-C 04/19/2015 4:40 PM

## 2015-04-19 NOTE — Progress Notes (Signed)
Family Medicine Teaching Service Daily Progress Note Intern Pager: [phone]  Patient name: [name] Medical record number: [id number] Date of birth: [birth year] Age: 64 y.o. Gender: male  Primary Care Provider: No primary care provider on file. Consultants: Cardiology Code Status: Full  Pt Overview and Major Events to Date:  4/20 - Admitted with chest pain  Assessment and Plan: [name] is a 64 y.o. male presenting with Chest pain. PMH is significant for CAD s/p MI and 3 stents placed in 2008, hypothyroidism, HTN, HLD.  CAD / ACS r/o. Heart score of 4 on admission. Work up thus far unremarkable. Possibly musculoskeletal in etiology, though concern for cardiac etiology given history. Patient with history of CAD with prior MI and stenting in 2008. Has not seen cardiologist in several years. EKG negative. Troponin negative x3. Lipid panel: total cholesterol 160, TG 204, HDL 30, LDL 89 - Cardiology consulted, appreciate assistance - Continue home ASA, pravastatin, and metoprolol  - A1c pending - Consider stress testing as outpatient if above workup negative.   HTN. At goal. - Continue home enalapril  Hypothyroidism. TSH at goal - Continue home synthroid  FEN/GI: Heart healthy diet, SLIV Prophylaxis: SubQ heparin  Disposition: Admitted to telemetry under attending Dr McDiarmid pending above work up. Possible discharge home later today.   Subjective:  Chest still feels sore this morning. No SOB or diaphoresis. Otherwise no complaints.   Objective: Temp:  [97.4 F (36.3 C)-98.5 F (36.9 C)] 97.9 F (36.6 C) (04/21 0751) Pulse Rate:  [52-66] 54 (04/21 0751) Resp:  [13-18] 17 (04/21 0751) BP: (112-142)/(63-87) 125/71 mmHg (04/21 0751) SpO2:  [95 %-100 %] 98 % (04/21 0751) Weight:  [236 lb 3.2 oz (107.14 kg)-240 lb (108.863 kg)] 236 lb 3.2 oz (107.14 kg) (04/20 2130) Physical Exam: General: Adult male lying in hospital bed in NAD HEENT: NCAT, EOMI,  MMM Cardiovascular: Bradycardic, regular rhythm. No murmurs appreciated. Tender to palpation over sternum. Respiratory: NWOB, CTAB with no wheezes or crackles Abdomen: soft, NT, ND, positive bowel sounds Extremities: No cyanosis or edema Skin: WWP, no rashes or skin breakdown Neuro: Alert and conversational. No focal neurological deficits.   Laboratory:  Recent Labs Lab 04/18/15 1850  WBC 7.9  HGB 14.2  HCT 42.6  PLT 202    Recent Labs Lab 04/18/15 1850  NA 137  K 5.0  CL 104  CO2 25  BUN 18  CREATININE 0.98  CALCIUM 8.9  GLUCOSE 98     Recent Labs Lab 04/18/15 1850 04/19/15 0021 04/19/15 0406  TROPONINI <0.03 <0.03 <0.03   EKG: Sinus bradycardia, no ischemic changes Lipid panel: total cholesterol 160, TG 204, HDL 30, LDL 89 TSH: 0.860  Vivi Barrack, MD 04/19/2015, 8:14 AM PGY-1, [place] Intern pager: [phone], text pages welcome

## 2015-04-20 LAB — HEMOGLOBIN A1C
HEMOGLOBIN A1C: 6.1 % — AB (ref 4.8–5.6)
Mean Plasma Glucose: 128 mg/dL

## 2016-03-27 ENCOUNTER — Encounter: Payer: Self-pay | Admitting: Cardiology

## 2016-03-27 ENCOUNTER — Ambulatory Visit (INDEPENDENT_AMBULATORY_CARE_PROVIDER_SITE_OTHER): Payer: 59 | Admitting: Cardiology

## 2016-03-27 VITALS — BP 130/82 | HR 57 | Ht 73.0 in | Wt 234.8 lb

## 2016-03-27 DIAGNOSIS — I10 Essential (primary) hypertension: Secondary | ICD-10-CM | POA: Diagnosis not present

## 2016-03-27 DIAGNOSIS — E785 Hyperlipidemia, unspecified: Secondary | ICD-10-CM | POA: Diagnosis not present

## 2016-03-27 DIAGNOSIS — R072 Precordial pain: Secondary | ICD-10-CM

## 2016-03-27 DIAGNOSIS — I2583 Coronary atherosclerosis due to lipid rich plaque: Secondary | ICD-10-CM

## 2016-03-27 DIAGNOSIS — I25110 Atherosclerotic heart disease of native coronary artery with unstable angina pectoris: Secondary | ICD-10-CM | POA: Insufficient documentation

## 2016-03-27 DIAGNOSIS — I2510 Atherosclerotic heart disease of native coronary artery without angina pectoris: Secondary | ICD-10-CM | POA: Diagnosis not present

## 2016-03-27 NOTE — Progress Notes (Signed)
Patient ID: [name], male   DOB: Jan 05, 1951, 65 y.o.   MRN: [id number]     Cardiology Office Note    Date:  03/27/2016   ID:  [name], DOB 1951/04/24, MRN [id number]  PCP And referring physician:  Dr Carolin Coy at [facility]  Cardiologist:  Dorothy Spark, MD   Chief Complaint  Patient presents with  . New Patient (Initial Visit)    History of Present Illness:  [name] is a 65 y.o. male with H/o CAD, with PCI/ 3 stents to unknown vessels in 2008 [facility]) presented to the [facility] in April 2016 with chest pain. He ruled out for ACS. He underwent an exercise nuclear stress test that showed no ischemia on EKG portion, perfusion images no no prior infarct and no ischemia. However his blood pressure went up to 215 and his blood pressure management was intensified.  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 He denies any palpitations or syncope no orthopnea no lower extremity edema.   Past Medical History  Diagnosis Date  . Diverticula of colon   . Hypertension   . Complication of anesthesia     DIFFICULTY WAKING  . Myocardial infarction ([place])   . Coronary artery disease     Past Surgical History  Procedure Laterality Date  . Cardiac stent    . Colon surgery    . Hernia repair    . Back surgery    . Cataract extraction    . Knee surgery    . Appendectomy      Current Medications: Outpatient Prescriptions Prior to Visit  Medication Sig Dispense Refill  . aspirin 81 MG tablet Take 81 mg by mouth daily.    . enalapril (VASOTEC) 20 MG tablet Take 10 mg by mouth daily.     Marland Kitchen glucosamine-chondroitin 500-400 MG tablet Take 1 tablet by mouth every morning.    Marland Kitchen levothyroxine (SYNTHROID, LEVOTHROID) 75 MCG  tablet Take 75 mcg by mouth daily before breakfast.    . metoprolol succinate (TOPROL-XL) 25 MG 24 hr tablet Take 25 mg by mouth daily. Patient takes 12.5 milligrams of this medication.    . niacin 500 MG tablet Take 500 mg by mouth daily with breakfast.    . polyethylene glycol (MIRALAX / GLYCOLAX) packet Take 17 g by mouth daily.    . pravastatin (PRAVACHOL) 80 MG tablet Take 80 mg by mouth daily.    . Probiotic Product (PROBIOTIC DAILY PO) Take 2 tablets by mouth daily.      No facility-administered medications prior to visit.     Allergies:   Review of patient's allergies indicates no known allergies.   Social History   Social History  . Marital Status: Married    Spouse Name: N/A  . Number of Children: N/A  . Years of Education: N/A   Social History Main Topics  . Smoking status: Former Research scientist (life sciences)  . Smokeless tobacco: Never Used     Comment: quit smoking 25 years ago  . Alcohol Use: Yes  . Drug Use: No  . Sexual Activity: Not Asked   Other Topics Concern  . None   Social History Narrative     Family History:  The  patient's family history is not on file.   ROS:   Please see the history of present illness.    ROS All other systems reviewed and are negative.   PHYSICAL EXAM:   VS:  BP 130/82 mmHg  Pulse 57  Ht '6\' 1"'$  (1.854 m)  Wt 234 lb 12.8 oz (106.505 kg)  BMI 30.98 kg/m2   GEN: Well nourished, well developed, in no acute distress HEENT: normal Neck: no JVD, carotid bruits, or masses Cardiac: RRR; no murmurs, rubs, or gallops,no edema  Respiratory:  clear to auscultation bilaterally, normal work of breathing GI: soft, nontender, nondistended, + BS MS: no deformity or atrophy Skin: warm and dry, no rash Neuro:  Alert and Oriented x 3, Strength and sensation are intact Psych: euthymic mood, full affect  Wt Readings from Last 3 Encounters:  03/27/16 234 lb 12.8 oz (106.505 kg)  04/18/15 236 lb 3.2 oz (107.14 kg)  03/10/12 224 lb (101.606 kg)       Studies/Labs Reviewed:    Recent Labs: 04/18/2015: BUN 18; Creatinine, Ser 0.98; Hemoglobin 14.2; Platelets 202; Potassium 5.0; Sodium 137 04/19/2015: TSH 0.860   Lipid Panel    Component Value Date/Time   CHOL 160 04/19/2015 0406   TRIG 204* 04/19/2015 0406   HDL 30* 04/19/2015 0406   CHOLHDL 5.3 04/19/2015 0406   VLDL 41* 04/19/2015 0406   LDLCALC 89 04/19/2015 0406    Exercise nuclear stress test: 04/19/2015 1. No definite scintigraphic evidence of prior infarction or exercise induced ischemia.  2. Normal left ventricular wall motion.  3. Left ventricular ejection fraction 58%  4. Low-risk stress test findings*.  EKG on 03/27/2016, sinus bradycardia, otherwise normal EKG.   ASSESSMENT/PLAN:    1. CAD, with PCI/ 3 stents to unknown vessels in 2008 - an exercise nuclear stress test to rule out ischemia. The patient has been asymptomatic and no ischemic workup is necessary at this time. His EKG is completely normal. Continue asa 81 mg po daily, metoprolol, enalapril and pravastatin  2. Hypertensive heart disease without heart failure - at goal on current regimen, patient is asking if we can decrease some of his medications however considering his blood pressure went up to 215 last year and there is no room in diameter exercise I would continue the same regimen.  3. Hyperlipidemia - his most recent labs from his primary care physician shows normal HDL and triglycerides however LDL 108 he is advised to watch his diet more closely and exercise more. Would continue pravastatin 80 mg po daily.  Follow up in 1 year.  Signed, Dorothy Spark, MD  03/27/2016 11:28 AM    [facility] [place], [place], [place]  [address] Phone: [phone]; Fax: [phone]

## 2016-03-27 NOTE — Patient Instructions (Signed)

## 2016-07-24 ENCOUNTER — Encounter: Payer: Self-pay | Admitting: Cardiology

## 2016-07-24 ENCOUNTER — Telehealth: Payer: Self-pay | Admitting: Cardiology

## 2016-07-24 NOTE — Telephone Encounter (Signed)
Follow-up      The pt is calling to let you know the medical Md had fax everything making sure you got it.

## 2016-07-24 NOTE — Telephone Encounter (Signed)
Informed the pt that the fax was received and I will scan this information into the system, so Dr Meda Coffee will be able to review this when available.  Pt verbalized understanding and gracious for all the assistance provided.

## 2016-07-24 NOTE — Telephone Encounter (Signed)
Pt is calling as instructed by his PCP Dr Danise Mina at [facility].  Pt states that he went on a trip out [place] about 4 weeks ago, and went down and up a gorge, and noted he became very winded and fatigued.  Pt states that ever since he did this activity, he has noted that he gets easier winded on exertion.  Pt states that sometimes he even becomes dizzy.  Pt denies any chest pain, SOB, LEE, palpitations, pre-syncopal or syncopal issues.  Pt states he is being compliant with taking all meds prescribed.  Pt states that he did go see his PCP Dr Hardin Negus for this issue, a couple of days ago.  Pt states that his PCP did a complete work-up on him and everything came back normal.   Pt states that his PCP ruled out anything acute.  Pt states all his labs were normal, he had a normal chest x-ray, he said his PCP told him his heart sounded great. Pt states that his PCP did refer him to Pulmonology for his complaints, and then instructed him to follow-up with Dr Meda Coffee, to possibly request for a stress test to be done, to completely rule out any cardiac issues.   Pt states PCP did this because of his cardiac history.  Pt states he is in no acute distress at this time, and this is not an urgent need to come into the office for, but he would like for Dr Meda Coffee to advise on requested testing.  Informed the pt that Dr Meda Coffee is out of the office this week and next week, but I will route this message to her for further review and recommendation of requested order for a stress test, and someone from the office will follow-up with him thereafter.  Advised the pt to call his PCP's office at [facility] and provide them our fax number at [phone], and have them send all test results and office notes done in their office this week.  Informed the pt this will be helpful information for Dr Meda Coffee to look at and treat accordingly. Advised the pt that in the meantime if he were to become acute from a cardiac perspective, then  he should notify our office immediately or refer to the ER.   Pt verbalized understanding, agrees with this plan, and gracious for all the assistance provided.

## 2016-07-24 NOTE — Telephone Encounter (Signed)
Pt c/o Shortness Of Breath: STAT if SOB developed within the last 24 hours or pt is noticeably SOB on the phone  1. Are you currently SOB (can you hear that pt is SOB on the phone)? no 2. How long have you been experiencing SOB? 4 weeks 3. Are you SOB when sitting or when up moving around? Moving around  4. Are you currently experiencing any other symptoms? dizziness

## 2017-06-05 ENCOUNTER — Encounter: Payer: Self-pay | Admitting: Cardiology

## 2017-06-25 ENCOUNTER — Ambulatory Visit: Payer: 59 | Admitting: Cardiology

## 2017-09-17 ENCOUNTER — Ambulatory Visit: Payer: 59 | Admitting: Cardiology

## 2017-09-17 ENCOUNTER — Telehealth: Payer: Self-pay | Admitting: Cardiology

## 2017-09-17 NOTE — Telephone Encounter (Signed)
Spoke with the pt and unfortunately, he is unable to make his follow-up appt with Dr Meda Coffee this morning at 0800, d/t a flat tire.  Pt states he only wants to see Dr Meda Coffee.  Rescheduled the pt to 10/22/17 at 0840 with Dr Meda Coffee.  Advised the pt that if he needs any refills prior to that appt, he should call into the office and request them and say, "okay per Dr Meda Coffee and myself." Pt verbalized understanding and agrees with this plan. Pt more than gracious for all the assistance provided.

## 2017-09-17 NOTE — Telephone Encounter (Signed)
New Message   pt verbalized that he is calling for rn   Wants to be seen today, offered to see a PA pt declined  Wanted to speak to the rn she said take a message

## 2017-09-28 ENCOUNTER — Ambulatory Visit: Payer: 59 | Admitting: Cardiology

## 2017-10-07 ENCOUNTER — Encounter: Payer: Self-pay | Admitting: Cardiology

## 2017-10-22 ENCOUNTER — Encounter: Payer: Self-pay | Admitting: Cardiology

## 2017-10-22 ENCOUNTER — Ambulatory Visit (INDEPENDENT_AMBULATORY_CARE_PROVIDER_SITE_OTHER): Payer: Medicare HMO | Admitting: Cardiology

## 2017-10-22 VITALS — BP 126/68 | HR 55 | Ht 73.0 in | Wt 235.0 lb

## 2017-10-22 DIAGNOSIS — I10 Essential (primary) hypertension: Secondary | ICD-10-CM | POA: Diagnosis not present

## 2017-10-22 DIAGNOSIS — I2510 Atherosclerotic heart disease of native coronary artery without angina pectoris: Secondary | ICD-10-CM

## 2017-10-22 DIAGNOSIS — E785 Hyperlipidemia, unspecified: Secondary | ICD-10-CM

## 2017-10-22 DIAGNOSIS — I2583 Coronary atherosclerosis due to lipid rich plaque: Secondary | ICD-10-CM | POA: Diagnosis not present

## 2017-10-22 NOTE — Patient Instructions (Signed)

## 2017-10-22 NOTE — Progress Notes (Signed)
Patient ID: [name], male   DOB: 1951/07/08, 66 y.o.   MRN: [id number]     Cardiology Office Note    Date:  10/22/2017   ID:  [name], DOB 1951/12/04, MRN [id number]  PCP And referring physician:  Dr Carolin Coy at [place]  Cardiologist:  Ena Dawley, MD   No chief complaint on file.   History of Present Illness:  [name] is a 66 y.o. male with H/o CAD, with PCI/ 3 stents to unknown vessels in 2008 [facility]) presented to the [facility] in April 2016 with chest pain. He ruled out for ACS. He underwent an exercise nuclear stress test that showed no ischemia on EKG portion, perfusion images no no prior infarct and no ischemia. However his blood pressure went up to 215 and his blood pressure management was intensified.  10/22/2017 - this is one year follow-up, the patient feels great, he has no chest pain shortness of breath no palpitations or claudication. He has no syncope no lower extremity edema. He has been compliant with his medication. His only complain our early-morning muscle cramps in his calves. He's been taking pravastatin for many years. He exercises regularly. He has his labs taken by his primary care physician last time in May and all normal including lipids, triglycerides 65, HDL 49, LDL 99.   Past Medical History:  Diagnosis Date  . Complication of anesthesia    DIFFICULTY WAKING  . Coronary artery disease   . Diverticula of colon   . Hypertension   . Myocardial infarction [facility])     Past Surgical History:  Procedure Laterality Date  . APPENDECTOMY    . BACK SURGERY    . cardiac stent    . CATARACT EXTRACTION    . COLON SURGERY    . HERNIA REPAIR    . KNEE SURGERY      Current Medications: Outpatient Medications Prior to Visit  Medication Sig Dispense Refill  . aspirin 81 MG tablet Take 81 mg by mouth daily.    . enalapril (VASOTEC) 20 MG tablet Take 10 mg by mouth daily.     Marland Kitchen  glucosamine-chondroitin 500-400 MG tablet Take 1 tablet by mouth every morning.    Marland Kitchen levothyroxine (SYNTHROID, LEVOTHROID) 75 MCG tablet Take 75 mcg by mouth daily before breakfast.    . metoprolol succinate (TOPROL-XL) 25 MG 24 hr tablet Take 25 mg by mouth daily. Patient takes 12.5 milligrams of this medication.    . polyethylene glycol (MIRALAX / GLYCOLAX) packet Take 17 g by mouth daily.    . pravastatin (PRAVACHOL) 80 MG tablet Take 80 mg by mouth daily.    . Probiotic Product (PROBIOTIC DAILY PO) Take 2 tablets by mouth daily.     . niacin 500 MG tablet Take 500 mg by mouth daily with breakfast.     No facility-administered medications prior to visit.      Allergies:   Patient has no known allergies.   Social History   Social History  . Marital status: Married    Spouse name: N/A  . Number of children: N/A  . Years of education: N/A   Social History Main Topics  . Smoking status: Former Research scientist (life sciences)  . Smokeless tobacco: Never Used     Comment: quit smoking 25 years ago  . Alcohol use Yes  . Drug use: No  . Sexual activity: Not Asked   Other Topics Concern  . None   Social  History Narrative  . None     Family History:  The patient's family history is not on file.   ROS:   Please see the history of present illness.    ROS All other systems reviewed and are negative.   PHYSICAL EXAM:   VS:  BP 126/68   Pulse (!) 55   Ht 6\' 1"  (1.854 m)   Wt 235 lb (106.6 kg)   SpO2 98%   BMI 31.00 kg/m    GEN: Well nourished, well developed, in no acute distress  HEENT: normal  Neck: no JVD, carotid bruits, or masses Cardiac: RRR; no murmurs, rubs, or gallops,no edema  Respiratory:  clear to auscultation bilaterally, normal work of breathing GI: soft, nontender, nondistended, + BS MS: no deformity or atrophy  Skin: warm and dry, no rash Neuro:  Alert and Oriented x 3, Strength and sensation are intact Psych: euthymic mood, full affect  Wt Readings from Last 3 Encounters:    10/22/17 235 lb (106.6 kg)  03/27/16 234 lb 12.8 oz (106.5 kg)  04/18/15 236 lb 3.2 oz (107.1 kg)      Studies/Labs Reviewed:    Recent Labs: No results found for requested labs within last 8760 hours.   Lipid Panel    Component Value Date/Time   CHOL 160 04/19/2015 0406   TRIG 204 (H) 04/19/2015 0406   HDL 30 (L) 04/19/2015 0406   CHOLHDL 5.3 04/19/2015 0406   VLDL 41 (H) 04/19/2015 0406   LDLCALC 89 04/19/2015 0406   Exercise nuclear stress test: 04/19/2015 1. No definite scintigraphic evidence of prior infarction or exercise induced ischemia.  2. Normal left ventricular wall motion.  3. Left ventricular ejection fraction 58%  4. Low-risk stress test findings*.  EKG Performed today 10/22/2017 shows sinus bradycardia otherwise normal EKG unchanged from prior. This was personally reviewed.     ASSESSMENT/PLAN:    1. CAD, with PCI/ 3 stents to unknown vessels in 2008 - an exercise nuclear stress test to rule out ischemia. The patient has been asymptomatic and no ischemic workup is necessary at this time. His EKG is completely normal. Continue asa 81 mg po daily, metoprolol, enalapril and pravastatin. I have offered to switch pravastatin however he would like to try a trial of hydration and magnesium first and if he continues to have cramping he would consider changing.  2. Hypertensive heart disease without heart failure - his blood pressure is well managed, is encouraged to continue exercising..  3. Hyperlipidemia - he has been experiencing ongoing muscle cramping, will try magnesium and hydration, if it doesn't work we'll consider switching to another statin. His LDL was 99 so we don't really have room to decrease the dose of pravastatin currently 80 mg daily.  Follow up in 1 year.  Signed, Ena Dawley, MD  10/22/2017 9:16 AM    [facility] [place], [place], [place]  [address] Phone: [phone]; Fax: [phone]

## 2018-12-13 ENCOUNTER — Ambulatory Visit: Payer: Medicare HMO | Admitting: Cardiology

## 2018-12-13 ENCOUNTER — Encounter: Payer: Self-pay | Admitting: Cardiology

## 2018-12-13 VITALS — BP 118/78 | HR 57 | Ht 73.0 in | Wt 229.8 lb

## 2018-12-13 DIAGNOSIS — I2510 Atherosclerotic heart disease of native coronary artery without angina pectoris: Secondary | ICD-10-CM | POA: Diagnosis not present

## 2018-12-13 DIAGNOSIS — I2583 Coronary atherosclerosis due to lipid rich plaque: Secondary | ICD-10-CM

## 2018-12-13 DIAGNOSIS — I10 Essential (primary) hypertension: Secondary | ICD-10-CM

## 2018-12-13 DIAGNOSIS — E785 Hyperlipidemia, unspecified: Secondary | ICD-10-CM | POA: Diagnosis not present

## 2018-12-13 NOTE — Progress Notes (Signed)
Patient ID: [name], male   DOB: [birth year], 67 y.o.   MRN: [id number]     Cardiology Office Note    Date:  12/13/2018   ID:  AL BRACEWELL, DOB 06/03/1951, MRN [id number]  PCP And referring physician:  Dr Carolin Coy at [place]  Cardiologist:  Ena Dawley, MD   No chief complaint on file.  History of Present Illness:  [name] is a 67 y.o. male with H/o CAD, with PCI/ 3 stents to unknown vessels in 2008 [facility]) presented to the [facility] in April 2016 with chest pain. He ruled out for ACS. He underwent an exercise nuclear stress test that showed no ischemia on EKG portion, perfusion images no no prior infarct and no ischemia. However his blood pressure went up to 215 and his blood pressure management was intensified. At 1-year follow-up in 09/2017 the patient felt great, he has no chest pain shortness of breath no palpitations or claudication. He has no syncope no lower extremity edema. He has been compliant with his medication. His only complain our early-morning muscle cramps in his calves. He's been taking pravastatin for many years. He exercises regularly. He has his labs taken by his primary care physician last time in May and all normal including lipids, triglycerides 65, HDL 49, LDL 99.  12/13/2018 -she is coming after 1 year.  He continues to feel well, he exercises and works on a farm and has no complaints of shortness of breath other than with strenuous exercise.  He has no chest pain, no palpitation dizziness no syncope.  He has been having headaches for which he underwent extensive work-up including neurological evaluation and brain MRI with no conclusive findings.  Continues to have night cramps.  Past Medical History:  Diagnosis Date  . Complication of anesthesia    DIFFICULTY WAKING  . Coronary artery disease   . Diverticula of colon   . Hypertension   . Myocardial infarction [facility])     Past Surgical History:    Procedure Laterality Date  . APPENDECTOMY    . BACK SURGERY    . cardiac stent    . CATARACT EXTRACTION    . COLON SURGERY    . HERNIA REPAIR    . KNEE SURGERY      Current Medications: Outpatient Medications Prior to Visit  Medication Sig Dispense Refill  . aspirin 81 MG tablet Take 81 mg by mouth daily.    Marland Kitchen b complex vitamins tablet Take 1 tablet by mouth daily.    . enalapril (VASOTEC) 20 MG tablet Take 10 mg by mouth daily.     Marland Kitchen glucosamine-chondroitin 500-400 MG tablet Take 1 tablet by mouth every morning.    Marland Kitchen levothyroxine (SYNTHROID, LEVOTHROID) 75 MCG tablet Take 75 mcg by mouth daily before breakfast.    . metoprolol succinate (TOPROL-XL) 25 MG 24 hr tablet Take 25 mg by mouth daily. Patient takes 12.5 milligrams of this medication.    . polyethylene glycol (MIRALAX / GLYCOLAX) packet Take 17 g by mouth daily.    . pravastatin (PRAVACHOL) 80 MG tablet Take 80 mg by mouth daily.    . Probiotic Product (PROBIOTIC DAILY PO) Take 2 tablets by mouth daily.      No facility-administered medications prior to visit.      Allergies:   Patient has no known allergies.   Social History   Socioeconomic History  . Marital status: Married    Spouse name: Not  on file  . Number of children: Not on file  . Years of education: Not on file  . Highest education level: Not on file  Occupational History  . Not on file  Social Needs  . Financial resource strain: Not on file  . Food insecurity:    Worry: Not on file    Inability: Not on file  . Transportation needs:    Medical: Not on file    Non-medical: Not on file  Tobacco Use  . Smoking status: Former Research scientist (life sciences)  . Smokeless tobacco: Never Used  . Tobacco comment: quit smoking 25 years ago  Substance and Sexual Activity  . Alcohol use: Yes  . Drug use: No  . Sexual activity: Not on file  Lifestyle  . Physical activity:    Days per week: Not on file    Minutes per session: Not on file  . Stress: Not on file   Relationships  . Social connections:    Talks on phone: Not on file    Gets together: Not on file    Attends religious service: Not on file    Active member of club or organization: Not on file    Attends meetings of clubs or organizations: Not on file    Relationship status: Not on file  Other Topics Concern  . Not on file  Social History Narrative  . Not on file     Family History:  The patient's family history is not on file.   ROS:   Please see the history of present illness.    ROS All other systems reviewed and are negative.   PHYSICAL EXAM:   VS:  BP 118/78   Pulse (!) 57   Ht 6\' 1"  (1.854 m)   Wt 229 lb 12.8 oz (104.2 kg)   SpO2 94%   BMI 30.32 kg/m    GEN: Well nourished, well developed, in no acute distress  HEENT: normal  Neck: no JVD, carotid bruits, or masses Cardiac: RRR; no murmurs, rubs, or gallops,no edema  Respiratory:  clear to auscultation bilaterally, normal work of breathing GI: soft, nontender, nondistended, + BS MS: no deformity or atrophy  Skin: warm and dry, no rash Neuro:  Alert and Oriented x 3, Strength and sensation are intact Psych: euthymic mood, full affect  Wt Readings from Last 3 Encounters:  12/13/18 229 lb 12.8 oz (104.2 kg)  10/22/17 235 lb (106.6 kg)  03/27/16 234 lb 12.8 oz (106.5 kg)      Studies/Labs Reviewed:    Recent Labs: No results found for requested labs within last 8760 hours.   Lipid Panel    Component Value Date/Time   CHOL 160 04/19/2015 0406   TRIG 204 (H) 04/19/2015 0406   HDL 30 (L) 04/19/2015 0406   CHOLHDL 5.3 04/19/2015 0406   VLDL 41 (H) 04/19/2015 0406   LDLCALC 89 04/19/2015 0406   Exercise nuclear stress test: 04/19/2015 1. No definite scintigraphic evidence of prior infarction or exercise induced ischemia.  2. Normal left ventricular wall motion.  3. Left ventricular ejection fraction 58%  4. Low-risk stress test findings*.  EKG Performed today 10/22/2017 shows sinus  bradycardia otherwise normal EKG unchanged from prior. This was personally reviewed.    ASSESSMENT/PLAN:    1. CAD, with PCI/ 3 stents to unknown vessels in 2008 -  EKG is completely normal.,  The patient is active and enalapril and pravastatin.   2. Hypertensive heart disease without heart failure - his blood pressure is  well managed, is encouraged to continue exercising..  3. Hyperlipidemia -ongoing night cramps however patient states it is not a big problem he will try over-the-counter magnesium, will continue pravastatin 80 mg daily.  We will obtain labs from his primary care physician Dr. Hardin Negus and follow up in 1 year.  Signed, Ena Dawley, MD  12/13/2018 9:01 AM    [facility] [place], [place], [place]  [address] Phone: [phone]; Fax: [phone]

## 2018-12-13 NOTE — Patient Instructions (Signed)
Medication Instructions:   Your physician recommends that you continue on your current medications as directed. Please refer to the Current Medication list given to you today.  If you need a refill on your cardiac medications before your next appointment, please call your pharmacy.      Follow-Up: At [facility], you and your health needs are our priority.  As part of our continuing mission to provide you with exceptional heart care, we have created designated Provider Care Teams.  These Care Teams include your primary Cardiologist (physician) and Advanced Practice Providers (APPs -  Physician Assistants and Nurse Practitioners) who all work together to provide you with the care you need, when you need it. You will need a follow up appointment in 1 years.  Please call our office 2 months in advance to schedule this appointment.  You may see Ena Dawley, MD or one of the following Advanced Practice Providers on your designated Care Team:   [place], PA-C Melina Copa, PA-C . Ermalinda Barrios, PA-C

## 2019-12-12 ENCOUNTER — Telehealth: Payer: Self-pay | Admitting: Cardiology

## 2019-12-12 NOTE — Telephone Encounter (Signed)
Pt c/o Shortness Of Breath: STAT if SOB developed within the last 24 hours or pt is noticeably SOB on the phone  1. Are you currently SOB (can you hear that pt is SOB on the phone)? no  2. How long have you been experiencing SOB? 5 days  3. Are you SOB when sitting or when up moving around? Just when moving  4. Are you currently experiencing any other symptoms? Chest pains   STAT if patient feels like he/she is going to faint   1) Are you dizzy now? no  2) Do you feel faint or have you passed out? no  3) Do you have any other symptoms? Chest pain  4) Have you checked your HR and BP (record if available)? No  Chest pains for a week off and on. Not experiencing them now. Patient is scheduled for an appointment 12/13/19 at 8am.

## 2019-12-12 NOTE — Telephone Encounter (Signed)
Pt is scheduled for complaints mentioned in this message, with Dr. Meda Coffee, in the office tomorrow 12/15 at 0800.  Pt will receive an EKG at that time.  Pt is calling to report to Dr. Meda Coffee that over the last week or so, he has been experiencing intermittent mid-sternal chest pain, aggravated by exertion.  Pt states he does not have his chest pain when at rest.   Pt states it feels like "a twinge or sharpe pain when it comes on.' Pt states he has been more winded than usual with this chest pain.  Pt states that he also has gotten light-headed one time, while driving, when his chest pain came on.  Pt states he had to get his wife to take over driving.  Pt states he has had no diaphoresis, N/V, radiating pain to his left arm or jaw/back area, pre-syncopal or syncopal episodes.  Pt states he has no swelling to his lower extremities.  Pt states he has had a cough for a couple of weeks, but is afebrile. He has no other respiratory complaints.  Pt is scheduled to see Dr. Meda Coffee tomorrow at 0800. Pt is very aware that if his symptoms return, or worsen between now and his visit tomorrow, then he should seek immediate medical care, and go to [facility] ER for further evaluation.  Informed the pt that he needs to wear his mask to his appt tomorrow, and arrive 15 mins prior to his appt.  Pt is aware that he cannot bring a visitor with him, due to restrictions right now.  Pt verbalized understanding and agrees with this plan. Informed the pt that I will route this message to Dr. Meda Coffee for her review as well.

## 2019-12-13 ENCOUNTER — Other Ambulatory Visit: Payer: Self-pay

## 2019-12-13 ENCOUNTER — Other Ambulatory Visit (HOSPITAL_COMMUNITY)
Admission: RE | Admit: 2019-12-13 | Discharge: 2019-12-13 | Disposition: A | Discharge status: Home / Self Care | Payer: Medicare HMO | Source: Ambulatory Visit | Attending: Internal Medicine | Admitting: Internal Medicine

## 2019-12-13 ENCOUNTER — Ambulatory Visit: Payer: Medicare HMO | Admitting: Cardiology

## 2019-12-13 ENCOUNTER — Encounter: Payer: Self-pay | Admitting: Cardiology

## 2019-12-13 VITALS — BP 126/74 | HR 54 | Ht 73.0 in | Wt 225.0 lb

## 2019-12-13 DIAGNOSIS — Z01812 Encounter for preprocedural laboratory examination: Secondary | ICD-10-CM | POA: Diagnosis present

## 2019-12-13 DIAGNOSIS — I10 Essential (primary) hypertension: Secondary | ICD-10-CM

## 2019-12-13 DIAGNOSIS — I2510 Atherosclerotic heart disease of native coronary artery without angina pectoris: Secondary | ICD-10-CM

## 2019-12-13 DIAGNOSIS — I200 Unstable angina: Secondary | ICD-10-CM | POA: Diagnosis not present

## 2019-12-13 DIAGNOSIS — Z20828 Contact with and (suspected) exposure to other viral communicable diseases: Secondary | ICD-10-CM | POA: Insufficient documentation

## 2019-12-13 DIAGNOSIS — E785 Hyperlipidemia, unspecified: Secondary | ICD-10-CM | POA: Diagnosis not present

## 2019-12-13 LAB — CBC
Hematocrit: 42.7 % (ref 37.5–51.0)
Hemoglobin: 14.7 g/dL (ref 13.0–17.7)
MCH: 30.6 pg (ref 26.6–33.0)
MCHC: 34.4 g/dL (ref 31.5–35.7)
MCV: 89 fL (ref 79–97)
Platelets: 248 10*3/uL (ref 150–450)
RBC: 4.81 x10E6/uL (ref 4.14–5.80)
RDW: 13.9 % (ref 11.6–15.4)
WBC: 8.1 10*3/uL (ref 3.4–10.8)

## 2019-12-13 LAB — COMPREHENSIVE METABOLIC PANEL
ALT: 29 IU/L (ref 0–44)
AST: 28 IU/L (ref 0–40)
Albumin/Globulin Ratio: 2 (ref 1.2–2.2)
Albumin: 4.5 g/dL (ref 3.8–4.8)
Alkaline Phosphatase: 81 IU/L (ref 39–117)
BUN/Creatinine Ratio: 16 (ref 10–24)
BUN: 14 mg/dL (ref 8–27)
Bilirubin Total: 0.5 mg/dL (ref 0.0–1.2)
CO2: 27 mmol/L (ref 20–29)
Calcium: 9.2 mg/dL (ref 8.6–10.2)
Chloride: 103 mmol/L (ref 96–106)
Creatinine, Ser: 0.9 mg/dL (ref 0.76–1.27)
GFR calc Af Amer: 102 mL/min/{1.73_m2} (ref >59)
GFR calc non Af Amer: 88 mL/min/{1.73_m2} (ref >59)
Globulin, Total: 2.2 g/dL (ref 1.5–4.5)
Glucose: 111 mg/dL — ABNORMAL HIGH (ref 65–99)
Potassium: 5.1 mmol/L (ref 3.5–5.2)
Sodium: 137 mmol/L (ref 134–144)
Total Protein: 6.7 g/dL (ref 6.0–8.5)

## 2019-12-13 LAB — PROTIME-INR
INR: 1.2 (ref 0.9–1.2)
Prothrombin Time: 13 s — ABNORMAL HIGH (ref 9.1–12.0)

## 2019-12-13 NOTE — H&P (View-Only) (Signed)
Patient ID: [name], male   DOB: Nov 22, 1951, 68 y.o.   MRN: [id number]     Cardiology Office Note    Date:  12/13/2019   ID:  [name], DOB 1951/12/26, MRN [id number]  PCP And referring physician:  Dr Carolin Coy at [place]  Cardiologist:  Ena Dawley, MD   Chief complaint:   History of Present Illness:  [name] is a 68 y.o. male with H/o CAD, with PCI/ 3 stents to unknown vessels in 2008 [facility]) presented to the [facility] in April 2016 with chest pain. He ruled out for ACS. He underwent an exercise nuclear stress test that showed no ischemia on EKG portion, perfusion images no no prior infarct and no ischemia. However his blood pressure went up to 215 and his blood pressure management was intensified. In 09/2017 lipids, triglycerides 65, HDL 49, LDL 99.  The patient states that in a last couple of weeks he has developed worsening exertional and resting, he also has developed exertional dizziness without a fall, he has noticed.  He continues to work.  He has been compliant with all of his medications, he continues to have occasional leg cramping otherwise tolerating his medications well.  Past Medical History:  Diagnosis Date  . Complication of anesthesia    DIFFICULTY WAKING  . Coronary artery disease   . Diverticula of colon   . Hypertension   . Myocardial infarction [facility])     Past Surgical History:  Procedure Laterality Date  . APPENDECTOMY    . BACK SURGERY    . cardiac stent    . CATARACT EXTRACTION    . COLON SURGERY    . HERNIA REPAIR    . KNEE SURGERY      Current Medications: Outpatient Medications Prior to Visit  Medication Sig Dispense Refill  . aspirin 81 MG tablet Take 81 mg by mouth daily.    Marland Kitchen b complex vitamins tablet Take 1 tablet by mouth daily.    . enalapril (VASOTEC) 20 MG tablet Take 10 mg by mouth daily.     Marland Kitchen glucosamine-chondroitin 500-400 MG tablet Take 1 tablet by mouth every  morning.    Marland Kitchen levothyroxine (SYNTHROID, LEVOTHROID) 75 MCG tablet Take 75 mcg by mouth daily before breakfast.    . metoprolol succinate (TOPROL-XL) 25 MG 24 hr tablet Take 12.5 mg by mouth 2 (two) times daily.     . polyethylene glycol (MIRALAX / GLYCOLAX) packet Take 17 g by mouth daily.    . pravastatin (PRAVACHOL) 80 MG tablet Take 80 mg by mouth daily.    . Probiotic Product (PROBIOTIC DAILY PO) Take 2 tablets by mouth daily.      No facility-administered medications prior to visit.     Allergies:   Patient has no known allergies.   Social History   Socioeconomic History  . Marital status: Married    Spouse name: Not on file  . Number of children: Not on file  . Years of education: Not on file  . Highest education level: Not on file  Occupational History  . Not on file  Tobacco Use  . Smoking status: Former Research scientist (life sciences)  . Smokeless tobacco: Never Used  . Tobacco comment: quit smoking 25 years ago  Substance and Sexual Activity  . Alcohol use: Yes  . Drug use: No  . Sexual activity: Not on file  Other Topics Concern  . Not on file  Social History Narrative  .  Not on file   Social Determinants of Health   Financial Resource Strain:   . Difficulty of Paying Living Expenses: Not on file  Food Insecurity:   . Worried About Charity fundraiser in the Last Year: Not on file  . Ran Out of Food in the Last Year: Not on file  Transportation Needs:   . Lack of Transportation (Medical): Not on file  . Lack of Transportation (Non-Medical): Not on file  Physical Activity:   . Days of Exercise per Week: Not on file  . Minutes of Exercise per Session: Not on file  Stress:   . Feeling of Stress : Not on file  Social Connections:   . Frequency of Communication with Friends and Family: Not on file  . Frequency of Social Gatherings with Friends and Family: Not on file  . Attends Religious Services: Not on file  . Active Member of Clubs or Organizations: Not on file  . Attends English as a second language teacher Meetings: Not on file  . Marital Status: Not on file     Family History:  The patient's family history is not on file.   ROS:   Please see the history of present illness.    ROS All other systems reviewed and are negative.   PHYSICAL EXAM:   VS:  BP 126/74   Pulse (!) 54   Ht 6\' 1"  (1.854 m)   Wt 225 lb (102.1 kg)   SpO2 97%   BMI 29.69 kg/m    GEN: Well nourished, well developed, in no acute distress  HEENT: normal  Neck: no JVD, carotid bruits, or masses Cardiac: RRR; no murmurs, rubs, or gallops,no edema  Respiratory:  clear to auscultation bilaterally, normal work of breathing GI: soft, nontender, nondistended, + BS MS: no deformity or atrophy  Skin: warm and dry, no rash Neuro:  Alert and Oriented x 3, Strength and sensation are intact Psych: euthymic mood, full affect  Wt Readings from Last 3 Encounters:  12/13/19 225 lb (102.1 kg)  12/13/18 229 lb 12.8 oz (104.2 kg)  10/22/17 235 lb (106.6 kg)      Studies/Labs Reviewed:    Recent Labs: No results found for requested labs within last 8760 hours.   Lipid Panel    Component Value Date/Time   CHOL 160 04/19/2015 0406   TRIG 204 (H) 04/19/2015 0406   HDL 30 (L) 04/19/2015 0406   CHOLHDL 5.3 04/19/2015 0406   VLDL 41 (H) 04/19/2015 0406   LDLCALC 89 04/19/2015 0406   Exercise nuclear stress test: 04/19/2015 1. No definite scintigraphic evidence of prior infarction or exercise induced ischemia.  2. Normal left ventricular wall motion.  3. Left ventricular ejection fraction 58%  4. Low-risk stress test findings*.  EKG Performed today 10/22/2017 shows sinus bradycardia otherwise normal EKG unchanged from prior. This was personally reviewed.    ASSESSMENT/PLAN:    1. CAD, with PCI/ 3 stents to unknown vessels in 2008 -the patient is having worsening angina and exertional dizziness and shortness of breath, we will obtain labs today including CMP CBC and INR and plan for cath after we  obtain Covid testing.  His EKG today is unchanged.  2. Hypertensive heart disease without heart failure - his blood pressure is well managed, is encouraged to continue exercising..  3. Hyperlipidemia -ongoing night cramps, after the cath we will try to hold statins and refer him to the lipid clinic.  We will schedule patient for Follow him up after hospital stay.  Signed, Ena Dawley, MD  12/13/2019 8:26 AM    [address], [place], [place]  [address] Phone: [phone]; Fax: [phone]

## 2019-12-13 NOTE — Progress Notes (Signed)
Patient ID: [name], Rose   DOB: 31-Jul-1951, 68 y.o.   MRN: [id number]     Cardiology Office Note    Date:  12/13/2019   ID:  [name], DOB [birth year], MRN [id number]  PCP And referring physician:  Dr Carolin Coy at [place]  Cardiologist:  Ena Dawley, MD   Chief complaint:   History of Present Illness:  [name] is a 68 y.o. Rose with H/o CAD, with PCI/ 3 stents to unknown vessels in 2008 [facility]) presented to the [facility] in April 2016 with chest pain. He ruled out for ACS. He underwent an exercise nuclear stress test that showed no ischemia on EKG portion, perfusion images no no prior infarct and no ischemia. However his blood pressure went up to 215 and his blood pressure management was intensified. In 09/2017 lipids, triglycerides 65, HDL 49, LDL 99.  The patient states that in a last couple of weeks he has developed worsening exertional and resting, he also has developed exertional dizziness without a fall, he has noticed.  He continues to work.  He has been compliant with all of his medications, he continues to have occasional leg cramping otherwise tolerating his medications well.  Past Medical History:  Diagnosis Date  . Complication of anesthesia    DIFFICULTY WAKING  . Coronary artery disease   . Diverticula of colon   . Hypertension   . Myocardial infarction [facility])     Past Surgical History:  Procedure Laterality Date  . APPENDECTOMY    . BACK SURGERY    . cardiac stent    . CATARACT EXTRACTION    . COLON SURGERY    . HERNIA REPAIR    . KNEE SURGERY      Current Medications: Outpatient Medications Prior to Visit  Medication Sig Dispense Refill  . aspirin 81 MG tablet Take 81 mg by mouth daily.    Marland Kitchen b complex vitamins tablet Take 1 tablet by mouth daily.    . enalapril (VASOTEC) 20 MG tablet Take 10 mg by mouth daily.     Marland Kitchen glucosamine-chondroitin 500-400 MG tablet Take 1 tablet by mouth every  morning.    Marland Kitchen levothyroxine (SYNTHROID, LEVOTHROID) 75 MCG tablet Take 75 mcg by mouth daily before breakfast.    . metoprolol succinate (TOPROL-XL) 25 MG 24 hr tablet Take 12.5 mg by mouth 2 (two) times daily.     . polyethylene glycol (MIRALAX / GLYCOLAX) packet Take 17 g by mouth daily.    . pravastatin (PRAVACHOL) 80 MG tablet Take 80 mg by mouth daily.    . Probiotic Product (PROBIOTIC DAILY PO) Take 2 tablets by mouth daily.      No facility-administered medications prior to visit.     Allergies:   Patient has no known allergies.   Social History   Socioeconomic History  . Marital status: Married    Spouse name: Not on file  . Number of children: Not on file  . Years of education: Not on file  . Highest education level: Not on file  Occupational History  . Not on file  Tobacco Use  . Smoking status: Former Research scientist (life sciences)  . Smokeless tobacco: Never Used  . Tobacco comment: quit smoking 25 years ago  Substance and Sexual Activity  . Alcohol use: Yes  . Drug use: No  . Sexual activity: Not on file  Other Topics Concern  . Not on file  Social History Narrative  .  Not on file   Social Determinants of Health   Financial Resource Strain:   . Difficulty of Paying Living Expenses: Not on file  Food Insecurity:   . Worried About Charity fundraiser in the Last Year: Not on file  . Ran Out of Food in the Last Year: Not on file  Transportation Needs:   . Lack of Transportation (Medical): Not on file  . Lack of Transportation (Non-Medical): Not on file  Physical Activity:   . Days of Exercise per Week: Not on file  . Minutes of Exercise per Session: Not on file  Stress:   . Feeling of Stress : Not on file  Social Connections:   . Frequency of Communication with Friends and Family: Not on file  . Frequency of Social Gatherings with Friends and Family: Not on file  . Attends Religious Services: Not on file  . Active Member of Clubs or Organizations: Not on file  . Attends English as a second language teacher Meetings: Not on file  . Marital Status: Not on file     Family History:  The patient's family history is not on file.   ROS:   Please see the history of present illness.    ROS All other systems reviewed and are negative.   PHYSICAL EXAM:   VS:  BP 126/74   Pulse (!) 54   Ht 6\' 1"  (1.854 m)   Wt 225 lb (102.1 kg)   SpO2 97%   BMI 29.69 kg/m    GEN: Well nourished, well developed, in no acute distress  HEENT: normal  Neck: no JVD, carotid bruits, or masses Cardiac: RRR; no murmurs, rubs, or gallops,no edema  Respiratory:  clear to auscultation bilaterally, normal work of breathing GI: soft, nontender, nondistended, + BS MS: no deformity or atrophy  Skin: warm and dry, no rash Neuro:  Alert and Oriented x 3, Strength and sensation are intact Psych: euthymic mood, full affect  Wt Readings from Last 3 Encounters:  12/13/19 225 lb (102.1 kg)  12/13/18 229 lb 12.8 oz (104.2 kg)  10/22/17 235 lb (106.6 kg)      Studies/Labs Reviewed:    Recent Labs: No results found for requested labs within last 8760 hours.   Lipid Panel    Component Value Date/Time   CHOL 160 04/19/2015 0406   TRIG 204 (H) 04/19/2015 0406   HDL 30 (L) 04/19/2015 0406   CHOLHDL 5.3 04/19/2015 0406   VLDL 41 (H) 04/19/2015 0406   LDLCALC 89 04/19/2015 0406   Exercise nuclear stress test: 04/19/2015 1. No definite scintigraphic evidence of prior infarction or exercise induced ischemia.  2. Normal left ventricular wall motion.  3. Left ventricular ejection fraction 58%  4. Low-risk stress test findings*.  EKG Performed today 10/22/2017 shows sinus bradycardia otherwise normal EKG unchanged from prior. This was personally reviewed.    ASSESSMENT/PLAN:    1. CAD, with PCI/ 3 stents to unknown vessels in 2008 -the patient is having worsening angina and exertional dizziness and shortness of breath, we will obtain labs today including CMP CBC and INR and plan for cath after we  obtain Covid testing.  His EKG today is unchanged.  2. Hypertensive heart disease without heart failure - his blood pressure is well managed, is encouraged to continue exercising..  3. Hyperlipidemia -ongoing night cramps, after the cath we will try to hold statins and refer him to the lipid clinic.  We will schedule patient for Follow him up after hospital stay.  Signed, Ena Dawley, MD  12/13/2019 8:26 AM    [address], [place], [place]  [address] Phone: [phone]; Fax: [phone]

## 2019-12-13 NOTE — Patient Instructions (Addendum)
Medication Instructions:   *If you need a refill on your cardiac medications before your next appointment, please call your pharmacy*  Lab Work: Your physician recommends that you have lab work today- CMET and CBC  If you have labs (blood work) drawn today and your tests are completely normal, you will receive your results only by: Marland Kitchen MyChart Message (if you have MyChart) OR . A paper copy in the mail If you have any lab test that is abnormal or we need to change your treatment, we will call you to review the results.  Testing/Procedures: Your physician has requested that you have a cardiac catheterization. Cardiac catheterization is used to diagnose and/or treat various heart conditions. Doctors may recommend this procedure for a number of different reasons. The most common reason is to evaluate chest pain. Chest pain can be a symptom of coronary artery disease (CAD), and cardiac catheterization can show whether plaque is narrowing or blocking your heart's arteries. This procedure is also used to evaluate the valves, as well as measure the blood flow and oxygen levels in different parts of your heart. For further information please visit [email or web address]. Please follow instruction sheet, as given.  Follow-Up: At [facility], you and your health needs are our priority.  As part of our continuing mission to provide you with exceptional heart care, we have created designated Provider Care Teams.  These Care Teams include your primary Cardiologist (physician) and Advanced Practice Providers (APPs -  Physician Assistants and Nurse Practitioners) who all work together to provide you with the care you need, when you need it.  Your next appointment:   3 week(s)  The format for your next appointment:   In Person  Provider:   You may see Ena Dawley, MD or one of the following Advanced Practice Providers on your designated Care Team:    Melina Copa, PA-C  Ermalinda Barrios, [facility]     [place] OFFICE [address], [address] [place] [address] Dept: [phone] Loc: [place]  12/13/2019  You are scheduled for a Cardiac Catheterization on Friday, December 18 with Dr. Harrell Gave End.  1. Please arrive at the [facility] (Main Entrance A) at [facility]: [place], [place] [address] at 5:30 AM (This time is two hours before your procedure to ensure your preparation). Free valet parking service is available.   Special note: Every effort is made to have your procedure done on time. Please understand that emergencies sometimes delay scheduled procedures.  2. Diet: Do not eat solid foods after midnight.  The patient may have clear liquids until 5am upon the day of the procedure.  3. Labs: Done today.  Your Pre-procedure COVID-19 Testing will be done on 12/13/19 at [place] at [address], [place], [place] [address]. Once you arrive at the testing site, stay in the right hand lane, go under the building overhang not the tent. If you are tested under the tent your results may not be back before your procedure. Please be on time for your appointment.  After your swab you will be given a mask to wear and instructed to go home and quarantine/no visitors until after your procedure. If you test positive you will be notified and your procedure will be cancelled.    4. Medication instructions in preparation for your procedure:   Contrast Allergy: No  On the morning  of your procedure, take your Aspirin 81 mg and any morning medicines NOT listed above.  You may use sips of water.  5. Plan for one night stay--bring personal belongings. 6. Bring a current list of your medications and current insurance cards. 7. You MUST have a responsible person to drive you home. 8. Someone MUST be with you the first 24 hours after you arrive  home or your discharge will be delayed. 9. Please wear clothes that are easy to get on and off and wear slip-on shoes.  Thank you for allowing [place] to care for you!   -- [facility]

## 2019-12-14 LAB — NOVEL CORONAVIRUS, NAA (HOSP ORDER, SEND-OUT TO REF LAB; TAT 18-24 HRS): SARS-CoV-2, NAA: NOT DETECTED

## 2019-12-15 ENCOUNTER — Telehealth: Payer: Self-pay | Admitting: *Deleted

## 2019-12-15 NOTE — Telephone Encounter (Signed)
Pt contacted pre-catheterization scheduled at [facility] for: Friday December 16, 2019 7:30 AM Verified arrival time and place: [place] [facility]) at: 5:30 AM   No solid food after midnight prior to cath, clear liquids until 5 AM day of procedure. Contrast allergy: no   AM meds can be  taken pre-cath with sip of water including: ASA 81 mg   Confirmed patient has responsible adult to drive home post procedure and observe 24 hours after arriving home: yes  Currently, due to Covid-19 pandemic, only one support person will be allowed with patient. Must be the same support person for that patient's entire stay, will be screened and required to wear a mask. They will be asked to wait in the waiting room for the duration of the patient's stay.  Patients are required to wear a mask when they enter the hospital.      COVID-19 Pre-Screening Questions:  . In the past 7 to 10 days have you had a cough,  shortness of breath, headache, congestion, fever (100 or greater) body aches, chills, sore throat, or sudden loss of taste or sense of smell? no . Have you been around anyone with known Covid 19? no . Have you been around anyone who is awaiting Covid 19 test results in the past 7 to 10 days? no . Have you been around anyone who has been exposed to Covid 19, or has mentioned symptoms of Covid 19 within the past 7 to 10 days? no  I reviewed procedure/mask/visitor instructions, Covid-19 screening questions with patient, he verbalized understanding, thanked me for call.

## 2019-12-16 ENCOUNTER — Other Ambulatory Visit: Payer: Self-pay

## 2019-12-16 ENCOUNTER — Encounter (HOSPITAL_COMMUNITY)
Admission: RE | Disposition: A | Discharge status: Home / Self Care | Payer: Self-pay | Source: Home / Self Care | Attending: Internal Medicine

## 2019-12-16 ENCOUNTER — Ambulatory Visit (HOSPITAL_COMMUNITY)
Admission: RE | Admit: 2019-12-16 | Discharge: 2019-12-16 | Disposition: A | Discharge status: Home / Self Care | Payer: Medicare HMO | Attending: Internal Medicine | Admitting: Internal Medicine

## 2019-12-16 DIAGNOSIS — I25110 Atherosclerotic heart disease of native coronary artery with unstable angina pectoris: Secondary | ICD-10-CM | POA: Diagnosis not present

## 2019-12-16 DIAGNOSIS — Z87891 Personal history of nicotine dependence: Secondary | ICD-10-CM | POA: Diagnosis not present

## 2019-12-16 DIAGNOSIS — E785 Hyperlipidemia, unspecified: Secondary | ICD-10-CM | POA: Diagnosis not present

## 2019-12-16 DIAGNOSIS — I252 Old myocardial infarction: Secondary | ICD-10-CM | POA: Diagnosis not present

## 2019-12-16 DIAGNOSIS — Z7982 Long term (current) use of aspirin: Secondary | ICD-10-CM | POA: Insufficient documentation

## 2019-12-16 DIAGNOSIS — Z79890 Hormone replacement therapy (postmenopausal): Secondary | ICD-10-CM | POA: Diagnosis not present

## 2019-12-16 DIAGNOSIS — I119 Hypertensive heart disease without heart failure: Secondary | ICD-10-CM | POA: Insufficient documentation

## 2019-12-16 DIAGNOSIS — Z79899 Other long term (current) drug therapy: Secondary | ICD-10-CM | POA: Diagnosis not present

## 2019-12-16 HISTORY — PX: LEFT HEART CATH AND CORONARY ANGIOGRAPHY: CATH118249

## 2019-12-16 SURGERY — LEFT HEART CATH AND CORONARY ANGIOGRAPHY
Anesthesia: LOCAL

## 2019-12-16 MED ORDER — HEPARIN (PORCINE) IN NACL 1000-0.9 UT/500ML-% IV SOLN
INTRAVENOUS | Status: AC
  Filled 2019-12-16: qty 1000

## 2019-12-16 MED ORDER — HEPARIN SODIUM (PORCINE) 1000 UNIT/ML IJ SOLN
INTRAMUSCULAR | Status: AC
  Filled 2019-12-16: qty 1

## 2019-12-16 MED ORDER — ISOSORBIDE MONONITRATE ER 30 MG PO TB24: 30.0000 mg | ORAL_TABLET | Freq: Every day | ORAL | 11 refills | Status: DC

## 2019-12-16 MED ORDER — VERAPAMIL HCL 2.5 MG/ML IV SOLN
INTRAVENOUS | Status: DC | PRN
  Administered 2019-12-16: 08:00:00 10 mL via INTRA_ARTERIAL

## 2019-12-16 MED ORDER — SODIUM CHLORIDE 0.9% FLUSH: 3.0000 mL | Freq: Two times a day (BID) | INTRAVENOUS | Status: DC

## 2019-12-16 MED ORDER — SODIUM CHLORIDE 0.9% FLUSH: 3.0000 mL | INTRAVENOUS | Status: DC | PRN

## 2019-12-16 MED ORDER — FENTANYL CITRATE (PF) 100 MCG/2ML IJ SOLN
INTRAMUSCULAR | Status: DC | PRN
  Administered 2019-12-16: 50 ug via INTRAVENOUS

## 2019-12-16 MED ORDER — SODIUM CHLORIDE 0.9 % IV SOLN: INTRAVENOUS | Status: DC

## 2019-12-16 MED ORDER — IOHEXOL 350 MG/ML SOLN
INTRAVENOUS | Status: DC | PRN
  Administered 2019-12-16: 08:00:00 60 mL

## 2019-12-16 MED ORDER — LIDOCAINE HCL (PF) 1 % IJ SOLN
INTRAMUSCULAR | Status: DC | PRN
  Administered 2019-12-16: 2 mL

## 2019-12-16 MED ORDER — NITROGLYCERIN 0.4 MG SL SUBL: 0.4000 mg | SUBLINGUAL_TABLET | SUBLINGUAL | 99 refills | Status: AC | PRN

## 2019-12-16 MED ORDER — MIDAZOLAM HCL 2 MG/2ML IJ SOLN
INTRAMUSCULAR | Status: AC
  Filled 2019-12-16: qty 2

## 2019-12-16 MED ORDER — SODIUM CHLORIDE 0.9 % IV SOLN: 250.0000 mL | INTRAVENOUS | Status: DC | PRN

## 2019-12-16 MED ORDER — HEPARIN SODIUM (PORCINE) 1000 UNIT/ML IJ SOLN
INTRAMUSCULAR | Status: DC | PRN
  Administered 2019-12-16: 5000 [IU] via INTRAVENOUS

## 2019-12-16 MED ORDER — VERAPAMIL HCL 2.5 MG/ML IV SOLN
INTRAVENOUS | Status: AC
  Filled 2019-12-16: qty 2

## 2019-12-16 MED ORDER — FENTANYL CITRATE (PF) 100 MCG/2ML IJ SOLN
INTRAMUSCULAR | Status: AC
  Filled 2019-12-16: qty 2

## 2019-12-16 MED ORDER — MIDAZOLAM HCL 2 MG/2ML IJ SOLN
INTRAMUSCULAR | Status: DC | PRN
  Administered 2019-12-16: 1 mg via INTRAVENOUS

## 2019-12-16 MED ORDER — SODIUM CHLORIDE 0.9 % WEIGHT BASED INFUSION
3.0000 mL/kg/h | INTRAVENOUS | Status: AC
  Administered 2019-12-16: 3 mL/kg/h via INTRAVENOUS

## 2019-12-16 MED ORDER — HYDRALAZINE HCL 20 MG/ML IJ SOLN: 10.0000 mg | INTRAMUSCULAR | Status: DC | PRN

## 2019-12-16 MED ORDER — SODIUM CHLORIDE 0.9 % WEIGHT BASED INFUSION: 1.0000 mL/kg/h | INTRAVENOUS | Status: DC

## 2019-12-16 MED ORDER — ACETAMINOPHEN 325 MG PO TABS: 650.0000 mg | ORAL_TABLET | ORAL | Status: DC | PRN

## 2019-12-16 MED ORDER — HEPARIN (PORCINE) IN NACL 1000-0.9 UT/500ML-% IV SOLN
INTRAVENOUS | Status: DC | PRN
  Administered 2019-12-16 (×2): 500 mL

## 2019-12-16 MED ORDER — LIDOCAINE HCL (PF) 1 % IJ SOLN
INTRAMUSCULAR | Status: AC
  Filled 2019-12-16: qty 30

## 2019-12-16 MED ORDER — LABETALOL HCL 5 MG/ML IV SOLN: 10.0000 mg | INTRAVENOUS | Status: DC | PRN

## 2019-12-16 MED ORDER — ONDANSETRON HCL 4 MG/2ML IJ SOLN: 4.0000 mg | Freq: Four times a day (QID) | INTRAMUSCULAR | Status: DC | PRN

## 2019-12-16 MED ORDER — ASPIRIN 81 MG PO CHEW: 81.0000 mg | CHEWABLE_TABLET | ORAL | Status: DC

## 2019-12-16 SURGICAL SUPPLY — 11 items
CATH INFINITI 5FR ANG PIGTAIL (CATHETERS) ×1 IMPLANT
CATH OPTITORQUE TIG 4.0 5F (CATHETERS) ×1 IMPLANT
DEVICE RAD COMP TR BAND LRG (VASCULAR PRODUCTS) ×1 IMPLANT
GLIDESHEATH SLEND SS 6F .021 (SHEATH) ×1 IMPLANT
GUIDEWIRE INQWIRE 1.5J.035X260 (WIRE) IMPLANT
INQWIRE 1.5J .035X260CM (WIRE) ×2
KIT HEART LEFT (KITS) ×2 IMPLANT
PACK CARDIAC CATHETERIZATION (CUSTOM PROCEDURE TRAY) ×2 IMPLANT
SYR MEDRAD MARK 7 150ML (SYRINGE) ×2 IMPLANT
TRANSDUCER W/STOPCOCK (MISCELLANEOUS) ×2 IMPLANT
TUBING CIL FLEX 10 FLL-RA (TUBING) ×2 IMPLANT

## 2019-12-16 NOTE — Interval H&P Note (Signed)
History and Physical Interval Note:  12/16/2019 7:13 AM  [name]  has presented today for cardiac catheterization, with the diagnosis of unstable angina.  The various methods of treatment have been discussed with the patient and family. After consideration of risks, benefits and other options for treatment, the patient has consented to  Procedure(s): LEFT HEART CATH AND CORONARY ANGIOGRAPHY (N/A) as a surgical intervention.  The patient's history has been reviewed, patient examined, no change in status, stable for surgery.  I have reviewed the patient's chart and labs.  Questions were answered to the patient's satisfaction.    Cath Lab Visit (complete for each Cath Lab visit)  Clinical Evaluation Leading to the Procedure:   ACS: No.  Non-ACS:    Anginal Classification: CCS IV  Anti-ischemic medical therapy: Minimal Therapy (1 class of medications)  Non-Invasive Test Results: No non-invasive testing performed  Prior CABG: No previous CABG  [name]

## 2019-12-16 NOTE — Discharge Instructions (Signed)
Radial Site Care  This sheet gives you information about how to care for yourself after your procedure. Your health care provider may also give you more specific instructions. If you have problems or questions, contact your health care provider. What can I expect after the procedure? After the procedure, it is common to have:  Bruising and tenderness at the catheter insertion area. Follow these instructions at home: Medicines  Take over-the-counter and prescription medicines only as told by your health care provider. Insertion site care  Follow instructions from your health care provider about how to take care of your insertion site. Make sure you: ? Wash your hands with soap and water before you change your bandage (dressing). If soap and water are not available, use hand sanitizer. ? Change your dressing as told by your health care provider. ? Leave stitches (sutures), skin glue, or adhesive strips in place. These skin closures may need to stay in place for 2 weeks or longer. If adhesive strip edges start to loosen and curl up, you may trim the loose edges. Do not remove adhesive strips completely unless your health care provider tells you to do that.  Check your insertion site every day for signs of infection. Check for: ? Redness, swelling, or pain. ? Fluid or blood. ? Pus or a bad smell. ? Warmth.  Do not take baths, swim, or use a hot tub until your health care provider approves.  You may shower 24-48 hours after the procedure, or as directed by your health care provider. ? Remove the dressing and gently wash the site with plain soap and water. ? Pat the area dry with a clean towel. ? Do not rub the site. That could cause bleeding.  Do not apply powder or lotion to the site. Activity   For 24 hours after the procedure, or as directed by your health care provider: ? Do not flex or bend the affected arm. ? Do not push or pull heavy objects with the affected arm. ? Do not  drive yourself home from the hospital or clinic. You may drive 24 hours after the procedure unless your health care provider tells you not to. ? Do not operate machinery or power tools.  Do not lift anything that is heavier than 10 lb (4.5 kg), or the limit that you are told, until your health care provider says that it is safe.  Ask your health care provider when it is okay to: ? Return to work or school. ? Resume usual physical activities or sports. ? Resume sexual activity. General instructions  If the catheter site starts to bleed, raise your arm and put firm pressure on the site. If the bleeding does not stop, get help right away. This is a medical emergency.  If you went home on the same day as your procedure, a responsible adult should be with you for the first 24 hours after you arrive home.  Keep all follow-up visits as told by your health care provider. This is important. Contact a health care provider if:  You have a fever.  You have redness, swelling, or yellow drainage around your insertion site. Get help right away if:  You have unusual pain at the radial site.  The catheter insertion area swells very fast.  The insertion area is bleeding, and the bleeding does not stop when you hold steady pressure on the area.  Your arm or hand becomes pale, cool, tingly, or numb. These symptoms may represent a serious problem  that is an emergency. Do not wait to see if the symptoms will go away. Get medical help right away. Call your local emergency services (911 in the [place] Do not drive yourself to the hospital. Summary  After the procedure, it is common to have bruising and tenderness at the site.  Follow instructions from your health care provider about how to take care of your radial site wound. Check the wound every day for signs of infection.  Do not lift anything that is heavier than 10 lb (4.5 kg), or the limit that you are told, until your health care provider says  that it is safe. This information is not intended to replace advice given to you by your health care provider. Make sure you discuss any questions you have with your health care provider. Document Released: 01/17/2011 Document Revised: 01/20/2018 Document Reviewed: 01/20/2018 Elsevier Patient Education  [place]  Isosorbide Mononitrate extended-release tablets What is this medicine? ISOSORBIDE MONONITRATE (eye soe SOR bide mon oh NYE trate) is a vasodilator. It relaxes blood vessels, increasing the blood and oxygen supply to your heart. This medicine is used to prevent chest pain caused by angina. It will not help to stop an episode of chest pain. This medicine may be used for other purposes; ask your health care provider or pharmacist if you have questions. COMMON BRAND NAME(S): Imdur, Isotrate ER What should I tell my health care provider before I take this medicine? They need to know if you have any of these conditions:  previous heart attack or heart failure  an unusual or allergic reaction to isosorbide mononitrate, nitrates, other medicines, foods, dyes, or preservatives  pregnant or trying to get pregnant  breast-feeding How should I use this medicine? Take this medicine by mouth with a glass of water. Follow the directions on the prescription label. Do not crush or chew. Take your medicine at regular intervals. Do not take your medicine more often than directed. Do not stop taking this medicine except on the advice of your doctor or health care professional. Talk to your pediatrician regarding the use of this medicine in children. Special care may be needed. Overdosage: If you think you have taken too much of this medicine contact a poison control center or emergency room at once. NOTE: This medicine is only for you. Do not share this medicine with others. What if I miss a dose? If you miss a dose, take it as soon as you can. If it is almost time for your next dose, take  only that dose. Do not take double or extra doses. What may interact with this medicine? Do not take this medicine with any of the following medications:  medicines used to treat erectile dysfunction (ED) like avanafil, sildenafil, tadalafil, and vardenafil  riociguat This medicine may also interact with the following medications:  medicines for high blood pressure  other medicines for angina or heart failure This list may not describe all possible interactions. Give your health care provider a list of all the medicines, herbs, non-prescription drugs, or dietary supplements you use. Also tell them if you smoke, drink alcohol, or use illegal drugs. Some items may interact with your medicine. What should I watch for while using this medicine? Check your heart rate and blood pressure regularly while you are taking this medicine. Ask your doctor or health care professional what your heart rate and blood pressure should be and when you should contact him or her. Tell your doctor or health care  professional if you feel your medicine is no longer working. You may get dizzy. Do not drive, use machinery, or do anything that needs mental alertness until you know how this medicine affects you. To reduce the risk of dizzy or fainting spells, do not sit or stand up quickly, especially if you are an older patient. Alcohol can make you more dizzy, and increase flushing and rapid heartbeats. Avoid alcoholic drinks. Do not treat yourself for coughs, colds, or pain while you are taking this medicine without asking your doctor or health care professional for advice. Some ingredients may increase your blood pressure. What side effects may I notice from receiving this medicine? Side effects that you should report to your doctor or health care professional as soon as possible:  bluish discoloration of lips, fingernails, or palms of hands  irregular heartbeat, palpitations  low blood pressure  nausea,  vomiting  persistent headache  unusually weak or tired Side effects that usually do not require medical attention (report to your doctor or health care professional if they continue or are bothersome):  flushing of the face or neck  rash This list may not describe all possible side effects. Call your doctor for medical advice about side effects. You may report side effects to FDA at 1-800-FDA-1088. Where should I keep my medicine? Keep out of the reach of children. Store between 15 and 30 degrees C (59 and 86 degrees F). Keep container tightly closed. Throw away any unused medicine after the expiration date. NOTE: This sheet is a summary. It may not cover all possible information. If you have questions about this medicine, talk to your doctor, pharmacist, or health care provider.  2020 Elsevier/Gold Standard (2013-10-14 14:48:19) .

## 2019-12-20 ENCOUNTER — Telehealth: Payer: Self-pay | Admitting: Cardiology

## 2019-12-20 MED ORDER — ISOSORBIDE MONONITRATE ER 30 MG PO TB24: 15.0000 mg | ORAL_TABLET | Freq: Every day | ORAL | 11 refills | Status: AC

## 2019-12-20 NOTE — Telephone Encounter (Signed)
    Pt c/o medication issue:  1. Name of Medication: isosorbide mononitrate (IMDUR) 30 MG 24 hr tablet  2. How are you currently taking this medication (dosage and times per day)? As written 3. Are you having a reaction (difficulty breathing--STAT)? no  4. What is your medication issue? Headache,fatique, patient states he has been feeling bad since he started medication

## 2019-12-20 NOTE — Telephone Encounter (Signed)
Patient states that he started taking Imdur 30 mg on Saturday 12/19 per Dr. Saunders Revel. He has had a bad headache that has persisted every since beginning Imdur. He has tried to take the medication at night to hopefully sleep through the headache but he reports still waking up with a headache. I advised him that this is fairly common as the body gets used to the medication and that it should gradually subside. I advised him to continue taking Imdur and that he can try taking some Tylenol for his headache. Patient verbalized understanding but would like to know if Dr. Saunders Revel or Dr. Meda Coffee has any recommendations.

## 2019-12-20 NOTE — Telephone Encounter (Signed)
I recommend that Mr. Kerstein try taking isosorbide mononitrate 15 mg daily.  Gerald Stabs

## 2019-12-20 NOTE — Telephone Encounter (Signed)
Call to patient to review advice from Dr. Saunders Revel. Pt verbalized understanding and is in agreement with POC>   Updated rx sent.   Pt will buy BP cuff and start taking daily. He will send update on sx and BP Monday via mychart.

## 2019-12-26 ENCOUNTER — Telehealth: Payer: Self-pay | Admitting: Cardiology

## 2019-12-26 NOTE — Telephone Encounter (Signed)
Updated BP reading as of 3:45pm: 152/104

## 2019-12-26 NOTE — Telephone Encounter (Signed)
I will defer continued BP management to Dr. Meda Coffee and PCP.  If headache has improved, I recommend that he continue isosorbide mononitrate 15 mg daily.  Nelva Bush, MD [facility]

## 2019-12-26 NOTE — Telephone Encounter (Signed)
Pt is sending an updated BP reading as he was advised to by Dr. Saunders Revel.  BP reading is as mentioned in this encounter.  Message is a follow-up from telephone encounter on 12/22 pt made to Dr. Saunders Revel and Dr. Meda Coffee. Will route this message to both Providers for further review and recommendation.

## 2019-12-27 NOTE — Telephone Encounter (Signed)
Left a message for the pt to call back to endorse recommendations per Dr. Saunders Revel and Dr. Meda Coffee, based on BP reading pt provided yesterday.

## 2019-12-27 NOTE — Telephone Encounter (Signed)
Spoke with the pt and endorsed to him per Dr. Saunders Revel, further BP management should be advised by Dr. Meda Coffee or his PCP.  Did inform the pt that per Dr. Saunders Revel, if his headache has improved, then he recommends that he continue Imdur 15 mg po daily. Pt states his headache has improved, and he is comfortable continuing Imdur 15 mg po daily. Pt states he will keep [place] informed if his BP increases on this dose, for further titration up on this med.  Pt verbalized understanding and agrees with this plan.

## 2020-01-25 ENCOUNTER — Ambulatory Visit: Payer: Medicare HMO | Admitting: Cardiology

## 2020-02-06 ENCOUNTER — Ambulatory Visit: Payer: Medicare HMO | Admitting: Cardiology

## 2020-02-06 ENCOUNTER — Encounter: Payer: Self-pay | Admitting: Cardiology

## 2020-02-06 ENCOUNTER — Other Ambulatory Visit: Payer: Self-pay

## 2020-02-06 VITALS — BP 128/82 | HR 62 | Ht 73.0 in | Wt 227.8 lb

## 2020-02-06 DIAGNOSIS — I25110 Atherosclerotic heart disease of native coronary artery with unstable angina pectoris: Secondary | ICD-10-CM

## 2020-02-06 DIAGNOSIS — I2510 Atherosclerotic heart disease of native coronary artery without angina pectoris: Secondary | ICD-10-CM

## 2020-02-06 MED ORDER — FISH OIL 1000 MG PO CAPS: 2000.0000 mg | ORAL_CAPSULE | Freq: Every day | ORAL | 0 refills | Status: AC

## 2020-02-06 NOTE — Patient Instructions (Signed)
Medication Instructions:   START TAKING OVER-THE-COUNTER FISH OIL 2,000 MG BY MOUTH DAILY  *If you need a refill on your cardiac medications before your next appointment, please call your pharmacy*    Follow-Up: At [facility], you and your health needs are our priority.  As part of our continuing mission to provide you with exceptional heart care, we have created designated Provider Care Teams.  These Care Teams include your primary Cardiologist (physician) and Advanced Practice Providers (APPs -  Physician Assistants and Nurse Practitioners) who all work together to provide you with the care you need, when you need it.  Your next appointment:   6 month(s)  The format for your next appointment:   In Person  Provider:   Ena Dawley, MD

## 2020-02-06 NOTE — Progress Notes (Signed)
Patient ID: [name], male   DOB: Feb 04, 1951, 69 y.o.   MRN: [id number]     Cardiology Office Note    Date:  02/06/2020   ID:  [name], DOB Jan 24, 1951, MRN [id number]  PCP And referring physician:  Dr Carolin Coy at [place]  Cardiologist:  Ena Dawley, MD   Chief complaint:   History of Present Illness:  [name] is a 69 y.o. male with H/o CAD, with PCI/ 3 stents to unknown vessels in 2008 [facility]) presented to the [facility] in April 2016 with chest pain. He ruled out for ACS. He underwent an exercise nuclear stress test that showed no ischemia on EKG portion, perfusion images no no prior infarct and no ischemia. However his blood pressure went up to 215 and his blood pressure management was intensified.  He presented in December 2020 with worsening angina and cardiac catheterization showed significant single-vessel coronary artery disease with sequential 70 to 80% ostial mid stenosis involving moderate size D1 branch otherwise minimal nonobstructive disease in the left main, circumflex and RCA.  Overlapping proximal and mid RCA stents are patent with mild in-stent restenosis.  Imdur was added to patient's management and it was decided that intervention should be performed only if his symptoms continue.  Today patient states that he has been feeling significantly better.  Is a very active [place] heavily with minimal symptoms, he is caring nitroglycerin with him but did not need to use it.  Denies any dizziness palpitations or syncope.   Past Medical History:  Diagnosis Date  . Complication of anesthesia    DIFFICULTY WAKING  . Coronary artery disease   . Diverticula of colon   . Hypertension   . Myocardial infarction [facility])     Past Surgical History:  Procedure Laterality Date  . APPENDECTOMY    . BACK SURGERY    . cardiac stent    . CATARACT EXTRACTION    . COLON SURGERY    . HERNIA REPAIR     . KNEE SURGERY    . LEFT HEART CATH AND CORONARY ANGIOGRAPHY N/A 12/16/2019   Procedure: LEFT HEART CATH AND CORONARY ANGIOGRAPHY;  Surgeon: Nelva Bush, MD;  Location: [place] [facility];  Service: Cardiovascular;  Laterality: N/A;    Current Medications: Outpatient Medications Prior to Visit  Medication Sig Dispense Refill  . aspirin 81 MG tablet Take 81 mg by mouth daily.    Marland Kitchen b complex vitamins tablet Take 1 tablet by mouth daily.    . enalapril (VASOTEC) 5 MG tablet Take 2.5 mg by mouth daily.    Marland Kitchen ibuprofen (ADVIL) 600 MG tablet Take 600 mg by mouth every 8 (eight) hours as needed for moderate pain.    . isosorbide mononitrate (IMDUR) 30 MG 24 hr tablet Take 0.5 tablets (15 mg total) by mouth daily. 15 tablet 11  . levothyroxine (SYNTHROID) 112 MCG tablet Take 112 mcg by mouth daily before breakfast.    . metoprolol tartrate (LOPRESSOR) 25 MG tablet Take 12.5 mg by mouth at bedtime.    . nitroGLYCERIN (NITROSTAT) 0.4 MG SL tablet Place 1 tablet (0.4 mg total) under the tongue every 5 (five) minutes as needed for chest pain. 25 tablet prn  . polyethylene glycol (MIRALAX / GLYCOLAX) packet Take 17 g by mouth daily.    . pravastatin (PRAVACHOL) 80 MG tablet Take 80 mg by mouth at bedtime.     Marland Kitchen  sodium chloride (OCEAN) 0.65 % SOLN nasal spray Place 1 spray into both nostrils as needed for congestion.     No facility-administered medications prior to visit.     Allergies:   Patient has no known allergies.   Social History   Socioeconomic History  . Marital status: Married    Spouse name: Not on file  . Number of children: Not on file  . Years of education: Not on file  . Highest education level: Not on file  Occupational History  . Not on file  Tobacco Use  . Smoking status: Former Research scientist (life sciences)  . Smokeless tobacco: Never Used  . Tobacco comment: quit smoking 25 years ago  Substance and Sexual Activity  . Alcohol use: Yes  . Drug use: No  . Sexual activity: Not on file    Other Topics Concern  . Not on file  Social History Narrative  . Not on file   Social Determinants of Health   Financial Resource Strain:   . Difficulty of Paying Living Expenses: Not on file  Food Insecurity:   . Worried About Charity fundraiser in the Last Year: Not on file  . Ran Out of Food in the Last Year: Not on file  Transportation Needs:   . Lack of Transportation (Medical): Not on file  . Lack of Transportation (Non-Medical): Not on file  Physical Activity:   . Days of Exercise per Week: Not on file  . Minutes of Exercise per Session: Not on file  Stress:   . Feeling of Stress : Not on file  Social Connections:   . Frequency of Communication with Friends and Family: Not on file  . Frequency of Social Gatherings with Friends and Family: Not on file  . Attends Religious Services: Not on file  . Active Member of Clubs or Organizations: Not on file  . Attends Archivist Meetings: Not on file  . Marital Status: Not on file     Family History:  The patient's family history is not on file.   ROS:   Please see the history of present illness.    ROS All other systems reviewed and are negative.  PHYSICAL EXAM:   VS:  BP 128/82   Pulse 62   Ht 6\' 1"  (1.854 m)   Wt 227 lb 12.8 oz (103.3 kg)   SpO2 96%   BMI 30.05 kg/m    GEN: Well nourished, well developed, in no acute distress  HEENT: normal  Neck: no JVD, carotid bruits, or masses Cardiac: RRR; no murmurs, rubs, or gallops,no edema  Respiratory:  clear to auscultation bilaterally, normal work of breathing GI: soft, nontender, nondistended, + BS MS: no deformity or atrophy  Skin: warm and dry, no rash Neuro:  Alert and Oriented x 3, Strength and sensation are intact Psych: euthymic mood, full affect  Wt Readings from Last 3 Encounters:  02/06/20 227 lb 12.8 oz (103.3 kg)  12/16/19 220 lb (99.8 kg)  12/13/19 225 lb (102.1 kg)      Studies/Labs Reviewed:    Recent Labs: 12/13/2019: ALT 29;  BUN 14; Creatinine, Ser 0.90; Hemoglobin 14.7; Platelets 248; Potassium 5.1; Sodium 137   Lipid Panel    Component Value Date/Time   CHOL 160 04/19/2015 0406   TRIG 204 (H) 04/19/2015 0406   HDL 30 (L) 04/19/2015 0406   CHOLHDL 5.3 04/19/2015 0406   VLDL 41 (H) 04/19/2015 0406   LDLCALC 89 04/19/2015 0406   Exercise nuclear stress test:  04/19/2015 1. No definite scintigraphic evidence of prior infarction or exercise induced ischemia.  2. Normal left ventricular wall motion.  3. Left ventricular ejection fraction 58%  4. Low-risk stress test findings*.  EKG Performed today 10/22/2017 shows sinus bradycardia otherwise normal EKG unchanged from prior. This was personally reviewed.    ASSESSMENT/PLAN:    1. CAD, with PCI/ 3 stents to RCA in 2008 -cardiac catheterization showed significant single-vessel coronary artery disease with sequential 70 to 80% ostial mid stenosis involving moderate size D1 branch otherwise minimal nonobstructive disease in the left main, circumflex and RCA.  Overlapping proximal and mid RCA stents are patent with mild in-stent restenosis.  Imdur was added to patient's management and it was decided that intervention should be performed only if his symptoms continue.  2. Hypertensive heart disease without heart failure - his blood pressure is well managed, is encouraged to continue exercising..  3. Hyperlipidemia -he is tolerating pravastatin well, we will add fish oil for elevated triglycerides 1 g twice daily.  Follow-up in 6 months unless patient has worsening symptoms, in that case intervention to first diagonal artery can be considered.  Signed, Ena Dawley, MD  02/06/2020 9:15 PM    [facility] [address], [place], [place]  [address] Phone: [phone]; Fax: [phone]

## 2020-03-07 ENCOUNTER — Other Ambulatory Visit: Payer: Self-pay | Admitting: Radiation Oncology

## 2020-03-07 ENCOUNTER — Ambulatory Visit
Admission: RE | Admit: 2020-03-07 | Discharge: 2020-03-07 | Disposition: A | Discharge status: Home / Self Care | Payer: Self-pay | Source: Ambulatory Visit | Attending: Radiation Oncology | Admitting: Radiation Oncology

## 2020-03-07 DIAGNOSIS — C3490 Malignant neoplasm of unspecified part of unspecified bronchus or lung: Secondary | ICD-10-CM

## 2020-03-07 NOTE — Progress Notes (Signed)
Thoracic Location of Tumor / Histology: Lung Mass  Patient presented Around christmas time he was having some chest pain, s/p 3 stents and heart attack. Saw PCP and was found to have small dime sized hemoptysis events earlier this month.  He was having progressive cough and some pleuritic discomfort of right chest since January.      Bronchoscopy:  MRI Brain 02/23/2020: No acute intracranial abnormality.  No evidence of intracranial metastasis.  PET 02/22/2020: Markedly hypermetabolic known right upper lobe lung mass.  Hypermetabolic enlarged right mediastinal and supraclavicular lymph nodes are likely metastases.  Non-enlarged, but mildly hypermetabolic left pre-vascular and supraclavicular lymph nodes are nonspecific, but also viewed with suspicion for metastatic disease.  The known 1.1 cm right lower lobe lung nodule demonstrates metabolic activity slightly below blood pool.  Based on the spiculated morphology this remains suspicious for a second primary.   CT Chest 02/16/2020: Emphysema with a right upper lobe mass measuring 4.7 x 3.9 x 5 cm extending into the right lower lobe, a spiculated 1.3 x 1 cm nodule in the periphery of the right lower lobe, and right sided mediastinal and likely right supraclavicular adenopathy, concerning for metastatic malignancy.   Biopsies of RUL lung 03/01/2020   Tobacco/Marijuana/Snuff/ETOH use: Former smoker  Past/Anticipated interventions by cardiothoracic surgery, if any:   Past/Anticipated interventions by medical oncology, if any:  Dr. Lenna Gilford. 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   -Ultimately we will need final biopsy to determine treatment options and he is scheduled for MRI tomorrow to evaluate for metastatic CNS disease. -We discussed that we anticipate chemotherapy and likely radiation therapy as well. -RadOnc referral placed today. -Follow-up appt on  03/21/2020 -Chemo start 03/22/2020  Signs/Symptoms  Weight changes, if any: Weight has been stable, he gradually worked off some of his weight, was about 240 in summer of 2020.  Respiratory complaints, if any: SOB, worse with activity.  Still able to move about.  Hemoptysis, if any: Has quite a few coughing spells, initially he saw blood, now has yellow/white sputum.  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  SAFETY ISSUES:  Prior radiation? No  Pacemaker/ICD? No  Possible current pregnancy? n/a  Is the patient on methotrexate? No  Current Complaints / other details:

## 2020-03-08 ENCOUNTER — Encounter: Payer: Self-pay | Admitting: Radiation Oncology

## 2020-03-08 ENCOUNTER — Ambulatory Visit
Admission: RE | Admit: 2020-03-08 | Discharge: 2020-03-08 | Disposition: A | Discharge status: Home / Self Care | Payer: Medicare HMO | Source: Ambulatory Visit | Attending: Radiation Oncology | Admitting: Radiation Oncology

## 2020-03-08 ENCOUNTER — Other Ambulatory Visit: Payer: Self-pay

## 2020-03-08 DIAGNOSIS — C3411 Malignant neoplasm of upper lobe, right bronchus or lung: Secondary | ICD-10-CM

## 2020-03-08 NOTE — Progress Notes (Signed)
Radiation Oncology         [phone]) [phone] ________________________________  Name: [name]        MRN: [id number]  Date of Service: 03/08/2020 DOB: 1951-07-26  JK:[id number], Rondel Oh, MD  Marijo File, MD     REFERRING PHYSICIAN: Marijo File, MD   DIAGNOSIS: The encounter diagnosis was Malignant neoplasm of upper lobe of right lung [facility]).   HISTORY OF PRESENT ILLNESS: [name] is a 69 y.o. male seen at the request of Dr. Neita Carp and Dr. Glennon Mac in medical oncology at the [facility]  The patient had been experiencing progressive cough, and episodes of hemoptysis and CT imaging on 02/16/2020 revealed a mass in the right upper lobe.  He had had a prior chest x-ray not long prior that was normal.  He was counseled on the rationale for pet imaging and underwent this testing on 02/22/2020.  This revealed hypermetabolism in the right upper lobe mass, as well as hypermetabolic adenopathy in the right mediastinum, supraclavicular nodes on the right and a 1.1 cm right lower lobe nodule with SUV below blood pool.  There was a possible left supraclavicular and prevascular node that was also hypermetabolic, but nonspecific.  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  He did undergo an MRI of the brain on 02/23/2020 also that was negative for disease.  We discussed his case with Dr. Durward Parcel at the  [facility], and he is classifying his disease is stage IIIA and is being referred to discuss chemoradiation.   PREVIOUS RADIATION THERAPY: No   PAST MEDICAL HISTORY:  Past Medical History:  Diagnosis Date  . Complication of anesthesia    DIFFICULTY WAKING  . Coronary artery disease   . Diverticula of colon   . Hypertension   . Myocardial infarction [facility])        PAST SURGICAL HISTORY: Past Surgical History:  Procedure Laterality Date  . APPENDECTOMY    . BACK SURGERY     . cardiac stent    . CATARACT EXTRACTION    . COLON SURGERY    . HERNIA REPAIR    . KNEE SURGERY    . LEFT HEART CATH AND CORONARY ANGIOGRAPHY N/A 12/16/2019   Procedure: LEFT HEART CATH AND CORONARY ANGIOGRAPHY;  Surgeon: Nelva Bush, MD;  Location: [place] [facility];  Service: Cardiovascular;  Laterality: N/A;     FAMILY HISTORY: No family history on file.   SOCIAL HISTORY:  reports that he has quit smoking. He has never used smokeless tobacco. He reports current alcohol use. He reports that he does not use drugs. The patient is married and lives in [place]. 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   ALLERGIES: Patient has no known allergies.   MEDICATIONS:  Current Outpatient Medications  Medication Sig Dispense Refill  . aspirin 81 MG tablet Take 81 mg by mouth daily.    Marland Kitchen b complex vitamins tablet Take 1 tablet by mouth daily.    . enalapril (VASOTEC) 5 MG tablet Take 2.5 mg by mouth daily.    Marland Kitchen ibuprofen (ADVIL) 600 MG tablet Take 600 mg by mouth every 8 (eight) hours as needed for moderate pain.    . isosorbide mononitrate (IMDUR) 30 MG 24 hr tablet Take 0.5 tablets (15 mg total) by mouth daily. 15 tablet 11  . levothyroxine (SYNTHROID) 112 MCG tablet Take 112  mcg by mouth daily before breakfast.    . metoprolol tartrate (LOPRESSOR) 25 MG tablet Take 12.5 mg by mouth at bedtime.    . nitroGLYCERIN (NITROSTAT) 0.4 MG SL tablet Place 1 tablet (0.4 mg total) under the tongue every 5 (five) minutes as needed for chest pain. 25 tablet prn  . Omega-3 Fatty Acids (FISH OIL) 1000 MG CAPS Take 2 capsules (2,000 mg total) by mouth daily. 180 capsule 0  . polyethylene glycol (MIRALAX / GLYCOLAX) packet Take 17 g by mouth daily.    . pravastatin (PRAVACHOL) 80 MG tablet Take 80 mg by mouth at bedtime.     . sodium chloride (OCEAN) 0.65 % SOLN nasal spray Place 1 spray into both nostrils as needed for congestion.     No current facility-administered medications  for this encounter.     REVIEW OF SYSTEMS: On review of systems, the patient reports that he is doing well overall. He continues to have a dry cough and no more episodes of hemoptysis. He denies any chest pain, shortness of breath, cough, fevers, chills, night sweats, unintended weight changes. He denies any bowel or bladder disturbances, and denies abdominal pain, nausea or vomiting. He denies any new musculoskeletal or joint aches or pains. A complete review of systems is obtained and is otherwise negative.     PHYSICAL EXAM:  Wt Readings from Last 3 Encounters:  02/06/20 227 lb 12.8 oz (103.3 kg)  12/16/19 220 lb (99.8 kg)  12/13/19 225 lb (102.1 kg)   In general this is a well appearing caucasian male in no acute distress. He's alert and oriented x4 and appropriate throughout the examination. Cardiopulmonary assessment is negative for acute distress and he exhibits normal effort.    ECOG = 1  0 - Asymptomatic (Fully active, able to carry on all predisease activities without restriction)  1 - Symptomatic but completely ambulatory (Restricted in physically strenuous activity but ambulatory and able to carry out work of a light or sedentary nature. For example, light housework, office work)  2 - Symptomatic, <50% in bed during the day (Ambulatory and capable of all self care but unable to carry out any work activities. Up and about more than 50% of waking hours)  3 - Symptomatic, >50% in bed, but not bedbound (Capable of only limited self-care, confined to bed or chair 50% or more of waking hours)  4 - Bedbound (Completely disabled. Cannot carry on any self-care. Totally confined to bed or chair)  5 - Death   Eustace Pen MM, Creech RH, Tormey DC, et al. [phone]). "Toxicity and response criteria of the [facility]". Onward Oncol. 5 (6): 649-55    LABORATORY DATA:  Lab Results  Component Value Date   WBC 8.1 12/13/2019   HGB 14.7 12/13/2019   HCT 42.7  12/13/2019   MCV 89 12/13/2019   PLT 248 12/13/2019   Lab Results  Component Value Date   NA 137 12/13/2019   K 5.1 12/13/2019   CL 103 12/13/2019   CO2 27 12/13/2019   Lab Results  Component Value Date   ALT 29 12/13/2019   AST 28 12/13/2019   ALKPHOS 81 12/13/2019   BILITOT 0.5 12/13/2019      RADIOGRAPHY: No results found.     IMPRESSION/PLAN: 1. 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 Dr. Lisbeth Renshaw discusses the pathology findings and reviews the nature of locally advanced lung disease.  He discusses the rationale for chemoradiation  with definitive intent for treatment, surgery would not address his disease in a meaningful way. We discussed the risks, benefits, short, and long term effects of radiotherapy, and the patient is interested in proceeding. Dr. Lisbeth Renshaw discusses the delivery and logistics of radiotherapy and anticipates a course of 6 1/2 weeks of radiotherapy to the right upper lobe and right regional nodes. Given the question of possible hypermetabolism in the left supraclavicular region, he would recommend covering this area as well.  We have coordinated with Dr. Durward Parcel as well for start date, he will simulate on Tuesday of next week, with the intention of starting treatment on 03/19/2020.  Written consent will be obtained at the time of simulation. 2. RLL nodule. We will follow along with his post treatment films to see if there is any significant change in the RLL nodule. He may be a candidate for stereotactic body radiotherapy if this persists or progresses. He is in agreement that this will be followed.   This encounter was provided by telemedicine platform MyChart.  The patient has provided two factor identification and has given verbal consent for this type of encounter and has been advised to only accept a meeting of this type in a secure network environment. The time spent during this encounter was 60 minutes including preparation,  discussion, and coordination of the patient's care. The attendants for this meeting include Blenda Nicely, RN, Dr. Lisbeth Renshaw, Hayden Pedro  and [name].  During the encounter,  Blenda Nicely, RN, Dr. Lisbeth Renshaw, and Hayden Pedro were located at [facility] Radiation Oncology Department.  Juel Burrow Blake was located at home.    The above documentation reflects my direct findings during this shared patient visit. Please see the separate note by Dr. Lisbeth Renshaw on this date for the remainder of the patient's plan of care.    Carola Rhine, PAC

## 2020-03-13 ENCOUNTER — Ambulatory Visit
Admission: RE | Admit: 2020-03-13 | Discharge: 2020-03-13 | Disposition: A | Discharge status: Home / Self Care | Payer: Medicare HMO | Source: Ambulatory Visit | Attending: Radiation Oncology | Admitting: Radiation Oncology

## 2020-03-13 ENCOUNTER — Other Ambulatory Visit: Payer: Self-pay

## 2020-03-13 DIAGNOSIS — C3411 Malignant neoplasm of upper lobe, right bronchus or lung: Secondary | ICD-10-CM | POA: Diagnosis not present

## 2020-03-13 DIAGNOSIS — Z510 Encounter for antineoplastic radiation therapy: Secondary | ICD-10-CM | POA: Insufficient documentation

## 2020-03-14 ENCOUNTER — Telehealth: Payer: Self-pay | Admitting: *Deleted

## 2020-03-14 NOTE — Telephone Encounter (Signed)
On 03/14/20 faxed medical records to [facility], it was consult note from 03/08/20

## 2020-03-16 DIAGNOSIS — Z510 Encounter for antineoplastic radiation therapy: Secondary | ICD-10-CM | POA: Diagnosis not present

## 2020-03-19 ENCOUNTER — Other Ambulatory Visit: Payer: Self-pay

## 2020-03-19 ENCOUNTER — Ambulatory Visit
Admission: RE | Admit: 2020-03-19 | Discharge: 2020-03-19 | Disposition: A | Discharge status: Home / Self Care | Payer: Medicare HMO | Source: Ambulatory Visit | Attending: Radiation Oncology | Admitting: Radiation Oncology

## 2020-03-19 DIAGNOSIS — Z510 Encounter for antineoplastic radiation therapy: Secondary | ICD-10-CM | POA: Diagnosis not present

## 2020-03-20 ENCOUNTER — Ambulatory Visit
Admission: RE | Admit: 2020-03-20 | Discharge: 2020-03-20 | Disposition: A | Discharge status: Home / Self Care | Payer: Medicare HMO | Source: Ambulatory Visit | Attending: Radiation Oncology | Admitting: Radiation Oncology

## 2020-03-20 ENCOUNTER — Other Ambulatory Visit: Payer: Self-pay

## 2020-03-20 DIAGNOSIS — Z510 Encounter for antineoplastic radiation therapy: Secondary | ICD-10-CM | POA: Diagnosis not present

## 2020-03-21 ENCOUNTER — Other Ambulatory Visit: Payer: Self-pay

## 2020-03-21 ENCOUNTER — Ambulatory Visit
Admission: RE | Admit: 2020-03-21 | Discharge: 2020-03-21 | Disposition: A | Discharge status: Home / Self Care | Payer: Medicare HMO | Source: Ambulatory Visit | Attending: Radiation Oncology | Admitting: Radiation Oncology

## 2020-03-21 DIAGNOSIS — C3411 Malignant neoplasm of upper lobe, right bronchus or lung: Secondary | ICD-10-CM

## 2020-03-21 DIAGNOSIS — Z510 Encounter for antineoplastic radiation therapy: Secondary | ICD-10-CM | POA: Diagnosis not present

## 2020-03-21 MED ORDER — SONAFINE EX EMUL
1.0000 "application " | Freq: Once | CUTANEOUS | Status: AC
  Administered 2020-03-21: 1 via TOPICAL

## 2020-03-21 NOTE — Progress Notes (Signed)

## 2020-03-22 ENCOUNTER — Ambulatory Visit
Admission: RE | Admit: 2020-03-22 | Discharge: 2020-03-22 | Disposition: A | Discharge status: Home / Self Care | Payer: Medicare HMO | Source: Ambulatory Visit | Attending: Radiation Oncology | Admitting: Radiation Oncology

## 2020-03-22 ENCOUNTER — Other Ambulatory Visit: Payer: Self-pay

## 2020-03-22 DIAGNOSIS — Z510 Encounter for antineoplastic radiation therapy: Secondary | ICD-10-CM | POA: Diagnosis not present

## 2020-03-23 ENCOUNTER — Other Ambulatory Visit: Payer: Self-pay

## 2020-03-23 ENCOUNTER — Ambulatory Visit
Admission: RE | Admit: 2020-03-23 | Discharge: 2020-03-23 | Disposition: A | Discharge status: Home / Self Care | Payer: Medicare HMO | Source: Ambulatory Visit | Attending: Radiation Oncology | Admitting: Radiation Oncology

## 2020-03-23 DIAGNOSIS — Z510 Encounter for antineoplastic radiation therapy: Secondary | ICD-10-CM | POA: Diagnosis not present

## 2020-03-26 ENCOUNTER — Other Ambulatory Visit: Payer: Self-pay

## 2020-03-26 ENCOUNTER — Ambulatory Visit
Admission: RE | Admit: 2020-03-26 | Discharge: 2020-03-26 | Disposition: A | Discharge status: Home / Self Care | Payer: Medicare HMO | Source: Ambulatory Visit | Attending: Radiation Oncology | Admitting: Radiation Oncology

## 2020-03-26 DIAGNOSIS — Z510 Encounter for antineoplastic radiation therapy: Secondary | ICD-10-CM | POA: Diagnosis not present

## 2020-03-27 ENCOUNTER — Other Ambulatory Visit: Payer: Self-pay

## 2020-03-27 ENCOUNTER — Ambulatory Visit
Admission: RE | Admit: 2020-03-27 | Discharge: 2020-03-27 | Disposition: A | Discharge status: Home / Self Care | Payer: Medicare HMO | Source: Ambulatory Visit | Attending: Radiation Oncology | Admitting: Radiation Oncology

## 2020-03-27 DIAGNOSIS — Z510 Encounter for antineoplastic radiation therapy: Secondary | ICD-10-CM | POA: Diagnosis not present

## 2020-03-28 ENCOUNTER — Ambulatory Visit
Admission: RE | Admit: 2020-03-28 | Discharge: 2020-03-28 | Disposition: A | Discharge status: Home / Self Care | Payer: Medicare HMO | Source: Ambulatory Visit | Attending: Radiation Oncology | Admitting: Radiation Oncology

## 2020-03-28 ENCOUNTER — Other Ambulatory Visit: Payer: Self-pay

## 2020-03-28 DIAGNOSIS — Z510 Encounter for antineoplastic radiation therapy: Secondary | ICD-10-CM | POA: Diagnosis not present

## 2020-03-29 ENCOUNTER — Ambulatory Visit
Admission: RE | Admit: 2020-03-29 | Discharge: 2020-03-29 | Disposition: A | Discharge status: Home / Self Care | Payer: No Typology Code available for payment source | Source: Ambulatory Visit | Attending: Radiation Oncology | Admitting: Radiation Oncology

## 2020-03-29 ENCOUNTER — Other Ambulatory Visit: Payer: Self-pay

## 2020-03-29 DIAGNOSIS — C3411 Malignant neoplasm of upper lobe, right bronchus or lung: Secondary | ICD-10-CM | POA: Insufficient documentation

## 2020-03-29 DIAGNOSIS — Z510 Encounter for antineoplastic radiation therapy: Secondary | ICD-10-CM | POA: Insufficient documentation

## 2020-03-30 ENCOUNTER — Ambulatory Visit
Admission: RE | Admit: 2020-03-30 | Discharge: 2020-03-30 | Disposition: A | Discharge status: Home / Self Care | Payer: No Typology Code available for payment source | Source: Ambulatory Visit | Attending: Radiation Oncology | Admitting: Radiation Oncology

## 2020-03-30 ENCOUNTER — Other Ambulatory Visit: Payer: Self-pay

## 2020-03-30 DIAGNOSIS — C3411 Malignant neoplasm of upper lobe, right bronchus or lung: Secondary | ICD-10-CM | POA: Diagnosis not present

## 2020-04-01 ENCOUNTER — Ambulatory Visit: Payer: No Typology Code available for payment source

## 2020-04-02 ENCOUNTER — Ambulatory Visit
Admission: RE | Admit: 2020-04-02 | Discharge: 2020-04-02 | Disposition: A | Discharge status: Home / Self Care | Payer: No Typology Code available for payment source | Source: Ambulatory Visit | Attending: Radiation Oncology | Admitting: Radiation Oncology

## 2020-04-02 ENCOUNTER — Telehealth: Payer: Self-pay | Admitting: *Deleted

## 2020-04-02 ENCOUNTER — Other Ambulatory Visit: Payer: Self-pay | Admitting: *Deleted

## 2020-04-02 ENCOUNTER — Other Ambulatory Visit: Payer: Self-pay

## 2020-04-02 ENCOUNTER — Other Ambulatory Visit: Payer: Self-pay | Admitting: Radiation Oncology

## 2020-04-02 ENCOUNTER — Ambulatory Visit: Payer: No Typology Code available for payment source | Attending: Internal Medicine

## 2020-04-02 DIAGNOSIS — Z20822 Contact with and (suspected) exposure to covid-19: Secondary | ICD-10-CM

## 2020-04-02 DIAGNOSIS — C3411 Malignant neoplasm of upper lobe, right bronchus or lung: Secondary | ICD-10-CM | POA: Diagnosis not present

## 2020-04-02 LAB — CBC WITH DIFFERENTIAL (CANCER CENTER ONLY)
Abs Immature Granulocytes: 0.02 10*3/uL (ref 0.00–0.07)
Basophils Absolute: 0 10*3/uL (ref 0.0–0.1)
Basophils Relative: 0 %
Eosinophils Absolute: 0.2 10*3/uL (ref 0.0–0.5)
Eosinophils Relative: 3 %
HCT: 42.3 % (ref 39.0–52.0)
Hemoglobin: 14.1 g/dL (ref 13.0–17.0)
Immature Granulocytes: 0 %
Lymphocytes Relative: 12 %
Lymphs Abs: 0.6 10*3/uL — ABNORMAL LOW (ref 0.7–4.0)
MCH: 29.9 pg (ref 26.0–34.0)
MCHC: 33.3 g/dL (ref 30.0–36.0)
MCV: 89.6 fL (ref 80.0–100.0)
Monocytes Absolute: 0.3 10*3/uL (ref 0.1–1.0)
Monocytes Relative: 6 %
Neutro Abs: 4.1 10*3/uL (ref 1.7–7.7)
Neutrophils Relative %: 79 %
Platelet Count: 229 10*3/uL (ref 150–400)
RBC: 4.72 MIL/uL (ref 4.22–5.81)
RDW: 13.1 % (ref 11.5–15.5)
WBC Count: 5.2 10*3/uL (ref 4.0–10.5)
nRBC: 0 % (ref 0.0–0.2)

## 2020-04-02 LAB — CMP (CANCER CENTER ONLY)
ALT: 44 U/L (ref 0–44)
AST: 26 U/L (ref 15–41)
Albumin: 3.4 g/dL — ABNORMAL LOW (ref 3.5–5.0)
Alkaline Phosphatase: 70 U/L (ref 38–126)
Anion gap: 10 (ref 5–15)
BUN: 16 mg/dL (ref 8–23)
CO2: 25 mmol/L (ref 22–32)
Calcium: 9.2 mg/dL (ref 8.9–10.3)
Chloride: 101 mmol/L (ref 98–111)
Creatinine: 0.92 mg/dL (ref 0.61–1.24)
GFR, Est AFR Am: >60 mL/min (ref >60)
GFR, Estimated: >60 mL/min (ref >60)
Glucose, Bld: 157 mg/dL — ABNORMAL HIGH (ref 70–99)
Potassium: 4.2 mmol/L (ref 3.5–5.1)
Sodium: 136 mmol/L (ref 135–145)
Total Bilirubin: 0.6 mg/dL (ref 0.3–1.2)
Total Protein: 7.6 g/dL (ref 6.5–8.1)

## 2020-04-02 LAB — MAGNESIUM: Magnesium: 1.6 mg/dL — ABNORMAL LOW (ref 1.7–2.4)

## 2020-04-02 MED ORDER — MAGNESIUM OXIDE 400 (241.3 MG) MG PO TABS: 400.0000 mg | ORAL_TABLET | Freq: Two times a day (BID) | ORAL | 0 refills | Status: AC

## 2020-04-02 MED ORDER — SUCRALFATE 1 G PO TABS: ORAL_TABLET | ORAL | 2 refills | Status: AC

## 2020-04-02 NOTE — Progress Notes (Signed)
I saw the patient today via webex following 11/33 fxns to the chest for stage III lung cancer. He has had progressive dysphagia and esophagitis from his radiotherapy since last week. He also had an episode over the weekend of coughing so hard that he had a near syncopal episode. His wife states he has been feeling quite rotten, and has had a low grade temp of 99 range "fevers" and he took tylenol. He has also had yellowing of his hands and face. They're due to see med onc at the [place] on Wednesday.  He is afebrile here today, but BP was 109/73 and pulse was 93. He has lost about 3 pounds in the last week.  In general this is a tired appearing caucasian male in no acute distress. He's alert and oriented x4 and appropriate throughout the examination. Cardiopulmonary assessment is negative for acute distress and he exhibits normal effort.     I suspect his coughing made him vagal/valsalva and that his pressure is lower given some weight loss. I recommended he check BP 3x a day for the next few days, and if his pressures are less than 130/90 then he should hold his metoprolol but he should also run this by his PCP. He is in agreement with this plan. The patient has not been vaccinated for covid yet and will discuss this with med onc on Wednesday at the [place]. He was around his sons who do not live with him without masks on, and given his symptoms and temperature, I think it's a good idea to rule out covid, and for CBC with diff, CMP and Magnesium. He is in agreement with this plan. I've also recommended OTC delsym or robitussin for cough suppression, and sent in a new rx to his pharmacy for carafate and explained how to take this. We will hold xrt until we know his covid results. If he has further episodes of near syncope he needs to be re-evaluated.     Carola Rhine, PAC

## 2020-04-02 NOTE — Telephone Encounter (Signed)
Called the patient and spoke with his wife and let her know that we received his lab results.  His magnesium was low, informed that we have sent in a prescription for magnesium to his pharmacy.  Instructions for use discussed.  I let her know that we would contact them with results from his recent covid testing to let him know when he could resume treatments.   She verbalized understanding.  Will continue to follow as necessary.  Gloriajean Dell. Leonie Green, BSN

## 2020-04-03 ENCOUNTER — Ambulatory Visit: Payer: No Typology Code available for payment source

## 2020-04-04 ENCOUNTER — Ambulatory Visit
Admission: RE | Admit: 2020-04-04 | Discharge: 2020-04-04 | Disposition: A | Discharge status: Home / Self Care | Payer: No Typology Code available for payment source | Source: Ambulatory Visit | Attending: Radiation Oncology | Admitting: Radiation Oncology

## 2020-04-04 ENCOUNTER — Telehealth: Payer: Self-pay | Admitting: Radiation Oncology

## 2020-04-04 ENCOUNTER — Other Ambulatory Visit: Payer: Self-pay

## 2020-04-04 DIAGNOSIS — C3411 Malignant neoplasm of upper lobe, right bronchus or lung: Secondary | ICD-10-CM | POA: Diagnosis not present

## 2020-04-04 LAB — SARS-COV-2, NAA 2 DAY TAT

## 2020-04-04 LAB — NOVEL CORONAVIRUS, NAA: SARS-CoV-2, NAA: NOT DETECTED

## 2020-04-04 NOTE — Telephone Encounter (Addendum)
I called to confirm that the patient is aware of his negative testing results for covid. He will continue with xrt. His wife states he is getting IVF at the [place] due to possible dehydration. He is holding metoprolol as well and has had normal BP but elevated pulse. We discussed gatorade popsicles, and he is going to try magic mouthwash from med onc, and his skin is worsening so I asked the [facility] to page me to see his skin tomorrow.

## 2020-04-05 ENCOUNTER — Other Ambulatory Visit: Payer: Self-pay

## 2020-04-05 ENCOUNTER — Telehealth: Payer: Self-pay | Admitting: Radiation Oncology

## 2020-04-05 ENCOUNTER — Ambulatory Visit
Admission: RE | Admit: 2020-04-05 | Discharge: 2020-04-05 | Disposition: A | Discharge status: Home / Self Care | Payer: No Typology Code available for payment source | Source: Ambulatory Visit | Attending: Radiation Oncology | Admitting: Radiation Oncology

## 2020-04-05 DIAGNOSIS — C3411 Malignant neoplasm of upper lobe, right bronchus or lung: Secondary | ICD-10-CM | POA: Diagnosis not present

## 2020-04-05 NOTE — Telephone Encounter (Signed)
I spoke with the patient after evaluating his right supraclavicular site which is showing dry desquamation, and let him know that we could switch to silvadene. I also recommended neosporin with pain relief and left a message as well for his wife.

## 2020-04-06 ENCOUNTER — Ambulatory Visit
Admission: RE | Admit: 2020-04-06 | Discharge: 2020-04-06 | Disposition: A | Discharge status: Home / Self Care | Payer: No Typology Code available for payment source | Source: Ambulatory Visit | Attending: Radiation Oncology | Admitting: Radiation Oncology

## 2020-04-06 ENCOUNTER — Other Ambulatory Visit: Payer: Self-pay

## 2020-04-06 DIAGNOSIS — C3411 Malignant neoplasm of upper lobe, right bronchus or lung: Secondary | ICD-10-CM | POA: Diagnosis not present

## 2020-04-09 ENCOUNTER — Telehealth: Payer: Self-pay | Admitting: *Deleted

## 2020-04-09 ENCOUNTER — Other Ambulatory Visit: Payer: Self-pay | Admitting: Radiation Oncology

## 2020-04-09 ENCOUNTER — Other Ambulatory Visit: Payer: Self-pay

## 2020-04-09 ENCOUNTER — Ambulatory Visit
Admission: RE | Admit: 2020-04-09 | Discharge: 2020-04-09 | Disposition: A | Discharge status: Home / Self Care | Payer: No Typology Code available for payment source | Source: Ambulatory Visit | Attending: Radiation Oncology | Admitting: Radiation Oncology

## 2020-04-09 DIAGNOSIS — C3411 Malignant neoplasm of upper lobe, right bronchus or lung: Secondary | ICD-10-CM | POA: Diagnosis not present

## 2020-04-09 MED ORDER — HYDROCODONE-ACETAMINOPHEN 7.5-325 MG/15ML PO SOLN: 10.0000 mL | ORAL | 0 refills | Status: DC | PRN

## 2020-04-09 NOTE — Telephone Encounter (Signed)
Spoke with the patient and his wife Jenny Reichmann to let them know that we have called in a prescription for Hycet to his pharmacy.  He is to also continue the magic mouthwash, carafate and to start using mylanta.  All medication instructions for use were reviewed with Jenny Reichmann, she verbalized understanding.  Will continue to follow as necessary.  Gloriajean Dell. Leonie Green, BSN

## 2020-04-09 NOTE — Progress Notes (Signed)
Pt complaining to staff about progressive esophagitis despite carafate and MMW. A new rx was sent in for hycet. We will follow expectantly.

## 2020-04-10 ENCOUNTER — Ambulatory Visit
Admission: RE | Admit: 2020-04-10 | Discharge: 2020-04-10 | Disposition: A | Discharge status: Home / Self Care | Payer: No Typology Code available for payment source | Source: Ambulatory Visit | Attending: Radiation Oncology | Admitting: Radiation Oncology

## 2020-04-10 ENCOUNTER — Other Ambulatory Visit: Payer: Self-pay

## 2020-04-10 DIAGNOSIS — C3411 Malignant neoplasm of upper lobe, right bronchus or lung: Secondary | ICD-10-CM | POA: Diagnosis not present

## 2020-04-11 ENCOUNTER — Ambulatory Visit
Admission: RE | Admit: 2020-04-11 | Discharge: 2020-04-11 | Disposition: A | Discharge status: Home / Self Care | Payer: No Typology Code available for payment source | Source: Ambulatory Visit | Attending: Radiation Oncology | Admitting: Radiation Oncology

## 2020-04-11 ENCOUNTER — Other Ambulatory Visit: Payer: Self-pay

## 2020-04-11 DIAGNOSIS — C3411 Malignant neoplasm of upper lobe, right bronchus or lung: Secondary | ICD-10-CM | POA: Diagnosis not present

## 2020-04-12 ENCOUNTER — Other Ambulatory Visit: Payer: Self-pay

## 2020-04-12 ENCOUNTER — Ambulatory Visit
Admission: RE | Admit: 2020-04-12 | Discharge: 2020-04-12 | Disposition: A | Discharge status: Home / Self Care | Payer: No Typology Code available for payment source | Source: Ambulatory Visit | Attending: Radiation Oncology | Admitting: Radiation Oncology

## 2020-04-12 DIAGNOSIS — C3411 Malignant neoplasm of upper lobe, right bronchus or lung: Secondary | ICD-10-CM | POA: Diagnosis not present

## 2020-04-13 ENCOUNTER — Other Ambulatory Visit: Payer: Self-pay

## 2020-04-13 ENCOUNTER — Ambulatory Visit
Admission: RE | Admit: 2020-04-13 | Discharge: 2020-04-13 | Disposition: A | Discharge status: Home / Self Care | Payer: No Typology Code available for payment source | Source: Ambulatory Visit | Attending: Radiation Oncology | Admitting: Radiation Oncology

## 2020-04-13 DIAGNOSIS — C3411 Malignant neoplasm of upper lobe, right bronchus or lung: Secondary | ICD-10-CM | POA: Diagnosis not present

## 2020-04-16 ENCOUNTER — Other Ambulatory Visit: Payer: Self-pay

## 2020-04-16 ENCOUNTER — Ambulatory Visit
Admission: RE | Admit: 2020-04-16 | Discharge: 2020-04-16 | Disposition: A | Discharge status: Home / Self Care | Payer: No Typology Code available for payment source | Source: Ambulatory Visit | Attending: Radiation Oncology | Admitting: Radiation Oncology

## 2020-04-16 DIAGNOSIS — C3411 Malignant neoplasm of upper lobe, right bronchus or lung: Secondary | ICD-10-CM | POA: Diagnosis not present

## 2020-04-17 ENCOUNTER — Other Ambulatory Visit: Payer: Self-pay

## 2020-04-17 ENCOUNTER — Ambulatory Visit
Admission: RE | Admit: 2020-04-17 | Discharge: 2020-04-17 | Disposition: A | Discharge status: Home / Self Care | Payer: No Typology Code available for payment source | Source: Ambulatory Visit | Attending: Radiation Oncology | Admitting: Radiation Oncology

## 2020-04-17 DIAGNOSIS — C3411 Malignant neoplasm of upper lobe, right bronchus or lung: Secondary | ICD-10-CM | POA: Diagnosis not present

## 2020-04-18 ENCOUNTER — Ambulatory Visit
Admission: RE | Admit: 2020-04-18 | Discharge: 2020-04-18 | Disposition: A | Discharge status: Home / Self Care | Payer: No Typology Code available for payment source | Source: Ambulatory Visit | Attending: Radiation Oncology | Admitting: Radiation Oncology

## 2020-04-18 ENCOUNTER — Other Ambulatory Visit: Payer: Self-pay

## 2020-04-18 DIAGNOSIS — C3411 Malignant neoplasm of upper lobe, right bronchus or lung: Secondary | ICD-10-CM | POA: Diagnosis not present

## 2020-04-19 ENCOUNTER — Other Ambulatory Visit: Payer: Self-pay

## 2020-04-19 ENCOUNTER — Ambulatory Visit
Admission: RE | Admit: 2020-04-19 | Discharge: 2020-04-19 | Disposition: A | Discharge status: Home / Self Care | Payer: No Typology Code available for payment source | Source: Ambulatory Visit | Attending: Radiation Oncology | Admitting: Radiation Oncology

## 2020-04-19 DIAGNOSIS — C3411 Malignant neoplasm of upper lobe, right bronchus or lung: Secondary | ICD-10-CM | POA: Diagnosis not present

## 2020-04-20 ENCOUNTER — Ambulatory Visit
Admission: RE | Admit: 2020-04-20 | Discharge: 2020-04-20 | Disposition: A | Discharge status: Home / Self Care | Payer: No Typology Code available for payment source | Source: Ambulatory Visit | Attending: Radiation Oncology | Admitting: Radiation Oncology

## 2020-04-20 ENCOUNTER — Other Ambulatory Visit: Payer: Self-pay

## 2020-04-20 DIAGNOSIS — C3411 Malignant neoplasm of upper lobe, right bronchus or lung: Secondary | ICD-10-CM | POA: Diagnosis not present

## 2020-04-23 ENCOUNTER — Other Ambulatory Visit: Payer: Self-pay

## 2020-04-23 ENCOUNTER — Ambulatory Visit
Admission: RE | Admit: 2020-04-23 | Discharge: 2020-04-23 | Disposition: A | Discharge status: Home / Self Care | Payer: No Typology Code available for payment source | Source: Ambulatory Visit | Attending: Radiation Oncology | Admitting: Radiation Oncology

## 2020-04-23 DIAGNOSIS — C3411 Malignant neoplasm of upper lobe, right bronchus or lung: Secondary | ICD-10-CM | POA: Diagnosis not present

## 2020-04-24 ENCOUNTER — Ambulatory Visit
Admission: RE | Admit: 2020-04-24 | Discharge: 2020-04-24 | Disposition: A | Discharge status: Home / Self Care | Payer: No Typology Code available for payment source | Source: Ambulatory Visit | Attending: Radiation Oncology | Admitting: Radiation Oncology

## 2020-04-24 ENCOUNTER — Other Ambulatory Visit: Payer: Self-pay

## 2020-04-24 DIAGNOSIS — C3411 Malignant neoplasm of upper lobe, right bronchus or lung: Secondary | ICD-10-CM | POA: Diagnosis not present

## 2020-04-25 ENCOUNTER — Ambulatory Visit
Admission: RE | Admit: 2020-04-25 | Discharge: 2020-04-25 | Disposition: A | Discharge status: Home / Self Care | Payer: No Typology Code available for payment source | Source: Ambulatory Visit | Attending: Radiation Oncology | Admitting: Radiation Oncology

## 2020-04-25 ENCOUNTER — Other Ambulatory Visit: Payer: Self-pay

## 2020-04-25 DIAGNOSIS — C3411 Malignant neoplasm of upper lobe, right bronchus or lung: Secondary | ICD-10-CM | POA: Diagnosis not present

## 2020-04-26 ENCOUNTER — Ambulatory Visit
Admission: RE | Admit: 2020-04-26 | Discharge: 2020-04-26 | Disposition: A | Discharge status: Home / Self Care | Payer: No Typology Code available for payment source | Source: Ambulatory Visit | Attending: Radiation Oncology | Admitting: Radiation Oncology

## 2020-04-26 ENCOUNTER — Other Ambulatory Visit: Payer: Self-pay

## 2020-04-26 DIAGNOSIS — C3411 Malignant neoplasm of upper lobe, right bronchus or lung: Secondary | ICD-10-CM | POA: Diagnosis not present

## 2020-04-27 ENCOUNTER — Ambulatory Visit
Admission: RE | Admit: 2020-04-27 | Discharge: 2020-04-27 | Disposition: A | Discharge status: Home / Self Care | Payer: No Typology Code available for payment source | Source: Ambulatory Visit | Attending: Radiation Oncology | Admitting: Radiation Oncology

## 2020-04-27 ENCOUNTER — Other Ambulatory Visit: Payer: Self-pay

## 2020-04-27 DIAGNOSIS — C3411 Malignant neoplasm of upper lobe, right bronchus or lung: Secondary | ICD-10-CM | POA: Diagnosis not present

## 2020-04-30 ENCOUNTER — Ambulatory Visit: Payer: Medicare HMO

## 2020-04-30 ENCOUNTER — Ambulatory Visit
Admission: RE | Admit: 2020-04-30 | Discharge: 2020-04-30 | Disposition: A | Discharge status: Home / Self Care | Payer: Medicare HMO | Source: Ambulatory Visit | Attending: Radiation Oncology | Admitting: Radiation Oncology

## 2020-04-30 ENCOUNTER — Other Ambulatory Visit: Payer: Self-pay | Admitting: Radiation Oncology

## 2020-04-30 ENCOUNTER — Other Ambulatory Visit: Payer: Self-pay

## 2020-04-30 DIAGNOSIS — C3411 Malignant neoplasm of upper lobe, right bronchus or lung: Secondary | ICD-10-CM | POA: Diagnosis not present

## 2020-04-30 DIAGNOSIS — Z510 Encounter for antineoplastic radiation therapy: Secondary | ICD-10-CM | POA: Diagnosis present

## 2020-04-30 MED ORDER — HYDROCODONE-ACETAMINOPHEN 7.5-325 MG/15ML PO SOLN: 10.0000 mL | ORAL | 0 refills | Status: DC | PRN

## 2020-05-01 ENCOUNTER — Other Ambulatory Visit: Payer: Self-pay

## 2020-05-01 ENCOUNTER — Ambulatory Visit: Admission: RE | Admit: 2020-05-01 | Payer: Medicare HMO | Source: Ambulatory Visit

## 2020-05-01 ENCOUNTER — Ambulatory Visit: Payer: Medicare HMO

## 2020-05-01 ENCOUNTER — Ambulatory Visit
Admission: RE | Admit: 2020-05-01 | Discharge: 2020-05-01 | Disposition: A | Discharge status: Home / Self Care | Payer: Medicare HMO | Source: Ambulatory Visit | Attending: Radiation Oncology | Admitting: Radiation Oncology

## 2020-05-01 DIAGNOSIS — C3411 Malignant neoplasm of upper lobe, right bronchus or lung: Secondary | ICD-10-CM | POA: Diagnosis not present

## 2020-05-02 ENCOUNTER — Ambulatory Visit: Payer: Medicare HMO

## 2020-05-02 ENCOUNTER — Ambulatory Visit
Admission: RE | Admit: 2020-05-02 | Discharge: 2020-05-02 | Disposition: A | Discharge status: Home / Self Care | Payer: Medicare HMO | Source: Ambulatory Visit | Attending: Radiation Oncology | Admitting: Radiation Oncology

## 2020-05-02 ENCOUNTER — Other Ambulatory Visit: Payer: Self-pay

## 2020-05-02 DIAGNOSIS — C3411 Malignant neoplasm of upper lobe, right bronchus or lung: Secondary | ICD-10-CM | POA: Diagnosis not present

## 2020-05-03 ENCOUNTER — Ambulatory Visit
Admission: RE | Admit: 2020-05-03 | Discharge: 2020-05-03 | Disposition: A | Discharge status: Home / Self Care | Payer: Medicare HMO | Source: Ambulatory Visit | Attending: Radiation Oncology | Admitting: Radiation Oncology

## 2020-05-03 ENCOUNTER — Ambulatory Visit: Payer: Medicare HMO

## 2020-05-03 ENCOUNTER — Other Ambulatory Visit: Payer: Self-pay

## 2020-05-03 ENCOUNTER — Encounter: Payer: Self-pay | Admitting: Radiation Oncology

## 2020-05-03 DIAGNOSIS — C3411 Malignant neoplasm of upper lobe, right bronchus or lung: Secondary | ICD-10-CM | POA: Diagnosis not present

## 2020-05-04 ENCOUNTER — Ambulatory Visit: Payer: Medicare HMO

## 2020-05-16 ENCOUNTER — Emergency Department (HOSPITAL_COMMUNITY)
Admission: EM | Admit: 2020-05-16 | Discharge: 2020-05-17 | Disposition: A | Discharge status: Home / Self Care | Payer: No Typology Code available for payment source | Attending: Emergency Medicine | Admitting: Emergency Medicine

## 2020-05-16 ENCOUNTER — Encounter (HOSPITAL_COMMUNITY): Payer: Self-pay | Admitting: Emergency Medicine

## 2020-05-16 DIAGNOSIS — I252 Old myocardial infarction: Secondary | ICD-10-CM | POA: Insufficient documentation

## 2020-05-16 DIAGNOSIS — I2510 Atherosclerotic heart disease of native coronary artery without angina pectoris: Secondary | ICD-10-CM | POA: Diagnosis not present

## 2020-05-16 DIAGNOSIS — Z87891 Personal history of nicotine dependence: Secondary | ICD-10-CM | POA: Insufficient documentation

## 2020-05-16 DIAGNOSIS — Z79899 Other long term (current) drug therapy: Secondary | ICD-10-CM | POA: Diagnosis not present

## 2020-05-16 DIAGNOSIS — I10 Essential (primary) hypertension: Secondary | ICD-10-CM | POA: Diagnosis not present

## 2020-05-16 DIAGNOSIS — K5641 Fecal impaction: Secondary | ICD-10-CM | POA: Diagnosis not present

## 2020-05-16 DIAGNOSIS — D6489 Other specified anemias: Secondary | ICD-10-CM | POA: Insufficient documentation

## 2020-05-16 DIAGNOSIS — Z7982 Long term (current) use of aspirin: Secondary | ICD-10-CM | POA: Diagnosis not present

## 2020-05-16 DIAGNOSIS — K5900 Constipation, unspecified: Secondary | ICD-10-CM | POA: Diagnosis present

## 2020-05-16 DIAGNOSIS — C3490 Malignant neoplasm of unspecified part of unspecified bronchus or lung: Secondary | ICD-10-CM | POA: Diagnosis not present

## 2020-05-16 DIAGNOSIS — D649 Anemia, unspecified: Secondary | ICD-10-CM

## 2020-05-16 LAB — CBC WITH DIFFERENTIAL/PLATELET
Abs Immature Granulocytes: 0.03 10*3/uL (ref 0.00–0.07)
Basophils Absolute: 0 10*3/uL (ref 0.0–0.1)
Basophils Relative: 0 %
Eosinophils Absolute: 0.1 10*3/uL (ref 0.0–0.5)
Eosinophils Relative: 1 %
HCT: 38.6 % — ABNORMAL LOW (ref 39.0–52.0)
Hemoglobin: 12.6 g/dL — ABNORMAL LOW (ref 13.0–17.0)
Immature Granulocytes: 1 %
Lymphocytes Relative: 14 %
Lymphs Abs: 0.8 10*3/uL (ref 0.7–4.0)
MCH: 30.5 pg (ref 26.0–34.0)
MCHC: 32.6 g/dL (ref 30.0–36.0)
MCV: 93.5 fL (ref 80.0–100.0)
Monocytes Absolute: 0.7 10*3/uL (ref 0.1–1.0)
Monocytes Relative: 12 %
Neutro Abs: 4.2 10*3/uL (ref 1.7–7.7)
Neutrophils Relative %: 72 %
Platelets: 275 10*3/uL (ref 150–400)
RBC: 4.13 MIL/uL — ABNORMAL LOW (ref 4.22–5.81)
RDW: 18.3 % — ABNORMAL HIGH (ref 11.5–15.5)
WBC: 5.8 10*3/uL (ref 4.0–10.5)
nRBC: 0 % (ref 0.0–0.2)

## 2020-05-16 LAB — URINALYSIS, ROUTINE W REFLEX MICROSCOPIC
Bilirubin Urine: NEGATIVE
Glucose, UA: NEGATIVE mg/dL
Hgb urine dipstick: NEGATIVE
Ketones, ur: 5 mg/dL — AB
Nitrite: NEGATIVE
Protein, ur: 30 mg/dL — AB
Specific Gravity, Urine: 1.027 (ref 1.005–1.030)
pH: 5 (ref 5.0–8.0)

## 2020-05-16 LAB — COMPREHENSIVE METABOLIC PANEL
ALT: 43 U/L (ref 0–44)
AST: 39 U/L (ref 15–41)
Albumin: 3.5 g/dL (ref 3.5–5.0)
Alkaline Phosphatase: 83 U/L (ref 38–126)
Anion gap: 12 (ref 5–15)
BUN: 14 mg/dL (ref 8–23)
CO2: 24 mmol/L (ref 22–32)
Calcium: 9.2 mg/dL (ref 8.9–10.3)
Chloride: 100 mmol/L (ref 98–111)
Creatinine, Ser: 0.79 mg/dL (ref 0.61–1.24)
GFR calc Af Amer: >60 mL/min (ref >60)
GFR calc non Af Amer: >60 mL/min (ref >60)
Glucose, Bld: 123 mg/dL — ABNORMAL HIGH (ref 70–99)
Potassium: 4.2 mmol/L (ref 3.5–5.1)
Sodium: 136 mmol/L (ref 135–145)
Total Bilirubin: 1 mg/dL (ref 0.3–1.2)
Total Protein: 7.3 g/dL (ref 6.5–8.1)

## 2020-05-16 LAB — POC OCCULT BLOOD, ED: Fecal Occult Bld: NEGATIVE

## 2020-05-16 LAB — LIPASE, BLOOD: Lipase: 23 U/L (ref 11–51)

## 2020-05-16 NOTE — ED Triage Notes (Signed)
Pt here from home sent by his MD to r/o S[facility] , pt has recently finished chemo and radiation for CA , pt has not has a BM in 3 weeks

## 2020-05-16 NOTE — ED Provider Notes (Signed)
[facility] EMERGENCY DEPARTMENT Provider Note   CSN: [id number] Arrival date & time: 05/16/20  1505   History Chief complaint: Abdominal pain  [name] is a 69 y.o. male.  The history is provided by the patient.  He has history of hypertension, coronary artery disease, hyperlipidemia, lung cancer and comes in because he has not had a bowel movement in the last 3 weeks.  Lung cancer was diagnosed 3 months ago and he was treated with combination of chemotherapy and radiation.  Tumor was initially adherent to his esophagus and he had not been eating anything.  However, when he resumed eating, he has not had any stool.  He did try using a fleets enema 3 weeks ago and did have a small bowel movement at that time.  He has had some difficulty with hemorrhoids with some bleeding.  He has been passing flatus.  There has been some crampy abdominal pain which has never been more severe than 5/10.  There is nausea and he does have some dry heaves but no vomiting.  He had an x-ray done at the [facility] today which showed a large stool burden.  Past Medical History:  Diagnosis Date  . Complication of anesthesia    DIFFICULTY WAKING  . Coronary artery disease   . Diverticula of colon   . Hypertension   . Myocardial infarction [facility])     Patient Active Problem List   Diagnosis Date Noted  . Malignant neoplasm of upper lobe of right lung ([place]) 03/08/2020  . Coronary artery disease involving native coronary artery of native heart with unstable angina pectoris ([place]) 03/27/2016  . Pain in the chest   . Thyroid activity decreased   . Essential hypertension   . HLD (hyperlipidemia)   . Chest pain 04/18/2015    Past Surgical History:  Procedure Laterality Date  . APPENDECTOMY    . BACK SURGERY    . cardiac stent    . CATARACT EXTRACTION    . COLON SURGERY    . HERNIA REPAIR    . KNEE SURGERY    . LEFT HEART CATH AND CORONARY ANGIOGRAPHY N/A 12/16/2019   Procedure:  LEFT HEART CATH AND CORONARY ANGIOGRAPHY;  Surgeon: Nelva Bush, MD;  Location: [place] [facility];  Service: Cardiovascular;  Laterality: N/A;       Family History  Problem Relation Age of Onset  . Cancer Mother   . Lung cancer Father   . Lung cancer Brother   . Emphysema Paternal Grandfather     Social History   Tobacco Use  . Smoking status: Former Research scientist (life sciences)  . Smokeless tobacco: Never Used  . Tobacco comment: quit smoking 25 years ago  Substance Use Topics  . Alcohol use: Never  . Drug use: No    Home Medications Prior to Admission medications   Medication Sig Start Date End Date Taking? Authorizing Provider  aspirin 81 MG tablet Take 81 mg by mouth daily.    [provider]  b complex vitamins tablet Take 1 tablet by mouth daily.    [provider]  enalapril (VASOTEC) 5 MG tablet Take 2.5 mg by mouth daily.    [provider]  HYDROcodone-acetaminophen (HYCET) 7.5-325 mg/15 ml solution Take 10-15 mLs by mouth every 4 (four) hours as needed for moderate pain. 04/30/20   Hayden Pedro, PA-C  ibuprofen (ADVIL) 600 MG tablet Take 600 mg by mouth every 8 (eight) hours as needed for moderate pain.  [provider]  isosorbide mononitrate (IMDUR) 30 MG 24 hr tablet Take 0.5 tablets (15 mg total) by mouth daily. 12/20/19 12/19/20  End, Harrell Gave, MD  levothyroxine (SYNTHROID) 112 MCG tablet Take 112 mcg by mouth daily before breakfast.    [provider]  magnesium oxide (MAG-OX) 400 (241.3 Mg) MG tablet Take 1 tablet (400 mg total) by mouth 2 (two) times daily. 04/02/20   Hayden Pedro, PA-C  metoprolol tartrate (LOPRESSOR) 25 MG tablet Take 12.5 mg by mouth at bedtime.    [provider]  nitroGLYCERIN (NITROSTAT) 0.4 MG SL tablet Place 1 tablet (0.4 mg total) under the tongue every 5 (five) minutes as needed for chest pain. 12/16/19 12/15/20  End, Harrell Gave, MD  Omega-3 Fatty Acids (FISH OIL) 1000 MG  CAPS Take 2 capsules (2,000 mg total) by mouth daily. Patient not taking: Reported on 03/08/2020 02/06/20   Dorothy Spark, MD  polyethylene glycol [facility] Floria Raveling) packet Take 17 g by mouth daily.    [provider]  pravastatin (PRAVACHOL) 80 MG tablet Take 80 mg by mouth at bedtime.     [provider]  sodium chloride (OCEAN) 0.65 % SOLN nasal spray Place 1 spray into both nostrils as needed for congestion.    [provider]  sucralfate (CARAFATE) 1 g tablet Crush and mix in 1 oz water and take po 5 min before meals for radiation induced esophagitis 04/02/20   Hayden Pedro, PA-C    Allergies    Patient has no known allergies.  Review of Systems   Review of Systems  All other systems reviewed and are negative.   Physical Exam Updated Vital Signs BP 109/79 (BP Location: Right Arm)   Pulse (!) 108   Temp 98.3 F (36.8 C) (Oral)   Resp 18   Ht 6\' 1"  (1.854 m)   Wt 92.5 kg   SpO2 97%   BMI 26.91 kg/m   Physical Exam Vitals and nursing note reviewed.   69 year old male, resting comfortably and in no acute distress. Vital signs are significant for slightly elevated heart rate. Oxygen saturation is 97%, which is normal. Head is normocephalic and atraumatic. PERRLA, EOMI. Oropharynx is clear. Neck is nontender and supple without adenopathy or JVD. Back is nontender and there is no CVA tenderness. Lungs are clear without rales, wheezes, or rhonchi. Chest is nontender.  Postradiation changes noted on the right side of the chest. Heart has regular rate and rhythm without murmur. Abdomen is soft, flat, with mild to moderate epigastric tenderness.  There is no rebound or guarding.  There are no masses or hepatosplenomegaly and peristalsis is slightly hypoactive. Rectal: Moderate amount of stool that is harder than normal but not rockhard.  No hemorrhoids seen or palpated.  Stool color is normal. Extremities have no cyanosis or edema, full range  of motion is present. Skin is warm and dry without other rash. Neurologic: Mental status is normal, cranial nerves are intact, there are no motor or sensory deficits.  ED Results / Procedures / Treatments   Labs (all labs ordered are listed, but only abnormal results are displayed) Labs Reviewed  COMPREHENSIVE METABOLIC PANEL - Abnormal; Notable for the following components:      Result Value   Glucose, Bld 123 (*)    All other components within normal limits  CBC WITH DIFFERENTIAL/PLATELET - Abnormal; Notable for the following components:   RBC 4.13 (*)    Hemoglobin 12.6 (*)  HCT 38.6 (*)    RDW 18.3 (*)    All other components within normal limits  URINALYSIS, ROUTINE W REFLEX MICROSCOPIC - Abnormal; Notable for the following components:   Color, Urine AMBER (*)    APPearance CLOUDY (*)    Ketones, ur 5 (*)    Protein, ur 30 (*)    Leukocytes,Ua TRACE (*)    Bacteria, UA RARE (*)    All other components within normal limits  LIPASE, BLOOD  POC OCCULT BLOOD, ED   Radiology CT ABDOMEN PELVIS W CONTRAST  Result Date: 05/17/2020 CLINICAL DATA:  Constipation. History of lung cancer. EXAM: CT ABDOMEN AND PELVIS WITH CONTRAST TECHNIQUE: Multidetector CT imaging of the abdomen and pelvis was performed using the standard protocol following bolus administration of intravenous contrast. CONTRAST:  171mL OMNIPAQUE IOHEXOL 300 MG/ML  SOLN COMPARISON:  July 09, 2011 FINDINGS: Lower chest: The lung bases are clear. The heart size is normal. Hepatobiliary: The liver is normal. Normal gallbladder.There is no biliary ductal dilation. Pancreas: Normal contours without ductal dilatation. No peripancreatic fluid collection. Spleen: Unremarkable. Adrenals/Urinary Tract: --Adrenal glands: Unremarkable. --Right kidney/ureter: No hydronephrosis or radiopaque kidney stones. --Left kidney/ureter: No hydronephrosis or radiopaque kidney stones. --Urinary bladder: Unremarkable. Stomach/Bowel:  --Stomach/Duodenum: No hiatal hernia or other gastric abnormality. Normal duodenal course and caliber. --Small bowel: Unremarkable. --Colon: There is a large amount of stool in the colon. --Appendix: Normal. Vascular/Lymphatic: Atherosclerotic calcification is present within the non-aneurysmal abdominal aorta, without hemodynamically significant stenosis. --No retroperitoneal lymphadenopathy. --No mesenteric lymphadenopathy. --No pelvic or inguinal lymphadenopathy. Reproductive: Unremarkable Other: No ascites or free air. The abdominal wall is normal. Musculoskeletal. No acute displaced fractures. IMPRESSION: 1. No acute abdominopelvic abnormality. 2. Large amount of stool in the colon. Aortic Atherosclerosis (ICD10-I70.0). Electronically Signed   By: Constance Holster M.D.   On: 05/17/2020 00:47    Procedures Procedures   Medications Ordered in ED Medications  iohexol (OMNIPAQUE) 300 MG/ML solution 100 mL (100 mLs Intravenous Contrast Given 05/17/20 0019)    ED Course  I have reviewed the triage vital signs and the nursing notes.  Pertinent labs & imaging results that were available during my care of the patient were reviewed by me and considered in my medical decision making (see chart for details).  MDM Rules/Calculators/A&P Abdominal pain with decreased bowel movements.  This could be functional constipation, but he does have history of abdominal surgery and I do feel that he needs to have a CT abdomen and pelvis to rule out obstruction.  Labs are significant for drop in hemoglobin from 14.1 on 04/03/2019 to 12.6 today.  Stools noted to be Hemoccult negative.  Old records were reviewed confirming recent radiation therapy for right upper lobe lung cancer.  CT scan shows large stool burden and otherwise unremarkable.  He is given a soapsuds enema.  He had good results from the soapsuds enema.  He is discharged with prescription for polyethylene glycol.  Return precautions discussed.  Final  Clinical Impression(s) / ED Diagnoses Final diagnoses:  Fecal impaction ([place])  Normochromic normocytic anemia    Rx / DC Orders ED Discharge Orders         Ordered    polyethylene glycol (GOLYTELY) 236 g solution   Once     05/17/20 3790           Delora Fuel, MD 24/09/73 [phone]

## 2020-05-17 ENCOUNTER — Emergency Department (HOSPITAL_COMMUNITY): Payer: No Typology Code available for payment source

## 2020-05-17 ENCOUNTER — Encounter (HOSPITAL_COMMUNITY): Payer: Self-pay | Admitting: Radiology

## 2020-05-17 MED ORDER — ONDANSETRON HCL 4 MG/2ML IJ SOLN
4.0000 mg | Freq: Once | INTRAMUSCULAR | Status: AC
  Administered 2020-05-17: 4 mg via INTRAVENOUS
  Filled 2020-05-17: qty 2

## 2020-05-17 MED ORDER — GOLYTELY 236 G PO SOLR: 4.0000 L | Freq: Once | ORAL | 0 refills | Status: AC

## 2020-05-17 MED ORDER — GOLYTELY 236 G PO SOLR: 4.0000 L | Freq: Once | ORAL | 0 refills | Status: DC

## 2020-05-17 MED ORDER — IOHEXOL 300 MG/ML  SOLN
100.0000 mL | Freq: Once | INTRAMUSCULAR | Status: AC | PRN
  Administered 2020-05-17: 100 mL via INTRAVENOUS

## 2020-05-17 NOTE — Discharge Instructions (Addendum)
Return if you are having any problems. 

## 2020-05-17 NOTE — ED Notes (Signed)
Pt transported to CT via stretcher.  

## 2020-05-27 NOTE — Progress Notes (Signed)
  Radiation Oncology         [phone] ________________________________  Name: [name] MRN: [id number]  Date: 05/03/2020  DOB: 24-Feb-1951  End of Treatment Note  Diagnosis:  Lung cancer     Indication for treatment::  curative       Radiation treatment dates:   03/19/20 - 05/03/20  Site/dose:   The patient was treated to the disease within the right lung initially to a dose of 60 Gy using a 4 field, 3-D conformal technique. The patient then received a cone down boost treatment for an additional 6 Gy. This yielded a final total dose of 66 Gy.   Narrative: The patient tolerated radiation treatment relatively well.   The patient did experience esophagitis during the course of treatment which required management.   Plan: The patient has completed radiation treatment. The patient will return to radiation oncology clinic for routine followup in one month. I advised the patient to call or return sooner if they have any questions or concerns related to their recovery or treatment. ________________________________  Jodelle Gross, M.D., Ph.D.

## 2020-06-26 ENCOUNTER — Telehealth: Payer: Self-pay | Admitting: Radiation Oncology

## 2020-06-26 NOTE — Telephone Encounter (Signed)
  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  Interval Since Last Radiation:  8 weeks   03/19/20 - 05/03/20: The patient was treated to the disease within the right lung initially to a dose of 60 Gy using a 4 field, 3-D conformal technique. The patient then received a cone down boost treatment for an additional 6 Gy. This yielded a final total dose of 66 Gy.   Narrative:  The patient was contacted today for routine follow-up. During treatment he did very well with radiotherapy. He did have dry desquamation at the base of his right neck. He reports he is doing well and his skin is almost completely healed and skin changes resolved. He denies any dysphagia any longer and is at the [place] getting his immunotherapy at the moment.  Impression/Plan: 1. 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 The patient has been doing well since completion of radiotherapy. We discussed that we would be happy to continue to follow him as needed, but he will also continue to follow up with Dr. Glennon Mac and the teaching service in medical oncology at the [facility].     Carola Rhine, PAC

## 2020-08-13 ENCOUNTER — Telehealth: Payer: Self-pay | Admitting: Radiation Oncology

## 2020-08-13 NOTE — Telephone Encounter (Addendum)
I called and LM for the nursing triage line at the [facility] [place] in [place]. [name] has a stage III lung cancer, but had a possible synchronous RLL nodule as well that may need to be treated with SBRT if it persisted. I asked that his films be mailed and report faxed from his last CT scan.

## 2020-08-20 ENCOUNTER — Other Ambulatory Visit: Payer: Self-pay | Admitting: Radiation Oncology

## 2020-08-20 ENCOUNTER — Ambulatory Visit
Admission: RE | Admit: 2020-08-20 | Discharge: 2020-08-20 | Disposition: A | Discharge status: Home / Self Care | Payer: Self-pay | Source: Ambulatory Visit | Attending: Radiation Oncology | Admitting: Radiation Oncology

## 2020-08-20 DIAGNOSIS — C3490 Malignant neoplasm of unspecified part of unspecified bronchus or lung: Secondary | ICD-10-CM

## 2020-08-21 ENCOUNTER — Other Ambulatory Visit: Payer: Self-pay

## 2020-08-21 ENCOUNTER — Encounter: Payer: Self-pay | Admitting: Radiation Oncology

## 2020-08-22 ENCOUNTER — Ambulatory Visit
Admission: RE | Admit: 2020-08-22 | Discharge: 2020-08-22 | Disposition: A | Discharge status: Home / Self Care | Payer: Self-pay | Source: Ambulatory Visit | Attending: Radiation Oncology | Admitting: Radiation Oncology

## 2020-08-22 ENCOUNTER — Other Ambulatory Visit: Payer: Self-pay | Admitting: Radiation Oncology

## 2020-08-22 DIAGNOSIS — C3490 Malignant neoplasm of unspecified part of unspecified bronchus or lung: Secondary | ICD-10-CM

## 2020-08-23 ENCOUNTER — Ambulatory Visit
Admission: RE | Admit: 2020-08-23 | Discharge: 2020-08-23 | Disposition: A | Discharge status: Home / Self Care | Payer: No Typology Code available for payment source | Source: Ambulatory Visit | Attending: Radiation Oncology | Admitting: Radiation Oncology

## 2020-08-23 ENCOUNTER — Other Ambulatory Visit: Payer: Self-pay | Admitting: Radiation Oncology

## 2020-08-23 ENCOUNTER — Ambulatory Visit
Admission: RE | Admit: 2020-08-23 | Discharge: 2020-08-23 | Disposition: A | Discharge status: Home / Self Care | Payer: Self-pay | Source: Ambulatory Visit | Attending: Radiation Oncology | Admitting: Radiation Oncology

## 2020-08-23 DIAGNOSIS — C3431 Malignant neoplasm of lower lobe, right bronchus or lung: Secondary | ICD-10-CM

## 2020-08-23 DIAGNOSIS — C3411 Malignant neoplasm of upper lobe, right bronchus or lung: Secondary | ICD-10-CM

## 2020-08-23 DIAGNOSIS — C3490 Malignant neoplasm of unspecified part of unspecified bronchus or lung: Secondary | ICD-10-CM

## 2020-08-25 DIAGNOSIS — C3431 Malignant neoplasm of lower lobe, right bronchus or lung: Secondary | ICD-10-CM | POA: Insufficient documentation

## 2020-08-25 NOTE — Progress Notes (Signed)
Radiation Oncology         [phone] ________________________________  Outpatient Follow Up - Conducted via telephone due to current COVID-19 concerns for limiting patient exposure  I spoke with the patient to conduct this consult visit via telephone to spare the patient unnecessary potential exposure in the healthcare setting during the current COVID-19 pandemic. The patient was notified in advance and was offered a [place] meeting to allow for face to face communication but unfortunately reported that they did not have the appropriate resources/technology to support such a visit and instead preferred to proceed with a telephone visit.    Name: [name]        MRN: [id number]  Date of Service: 08/23/2020 DOB: [birth year]  JH:ERDEYC, Rondel Oh, MD  Marijo File, MD     REFERRING PHYSICIAN: Marijo File, MD   DIAGNOSIS: The primary encounter diagnosis was Malignant neoplasm of upper lobe of right lung [facility]). A diagnosis of Malignant neoplasm of lower lobe of right lung [facility]) was also pertinent to this visit.   HISTORY OF PRESENT ILLNESS: [name] is a 69 y.o. male seen at the request of Dr. Neita Carp at the [facility] as well as the [facility], Dr. Glennon Mac and Dr. 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 The patient was originally diagnosed in the spring of this year, and subsequently underwent a course of chemoradiation.  He has received continued immunotherapy as consolidative treatment, and has been followed closely.  At the onset of his diagnosis, there was concern for a right lower lobe nodule that needed to be followed.  His initial PET was on 02/22/2020 which revealed hypermetabolism anticipated in the right upper lobe and mediastinum and supraclavicular nodes.  The 1.1 cm right lower lobe nodule had an SUV below blood pool and for this reason was not treated.  His most recent imaging in July which was done at the [place] in  the emergency department due to increasing shortness of breath revealed the right posterior lower lobe lesion to still be about 1 cm but appearing more dense.  He returned for restaging scan on August 15, 2020, measured the area at 1.4 cm.  Given these findings he is contacted today to discuss treatment options for stereotactic body radiotherapy.     PREVIOUS RADIATION THERAPY: Yes   03/19/20 - 05/03/20: The patient was treated to the disease within the rightlung initially to a dose of 60 Gy using a 12field, 3-D conformal technique. The patient then received a cone down boost treatment for an additional 6 Gy. This yielded a final total dose of 66 Gy.    PAST MEDICAL HISTORY:  Past Medical History:  Diagnosis Date  . Complication of anesthesia    DIFFICULTY WAKING  . Coronary artery disease   . Diverticula of colon   . Hypertension   . Myocardial infarction [facility])        PAST SURGICAL HISTORY: Past Surgical History:  Procedure Laterality Date  . APPENDECTOMY    . BACK SURGERY    . cardiac stent    . CATARACT EXTRACTION    . COLON SURGERY    . HERNIA REPAIR    . KNEE SURGERY    . LEFT HEART CATH AND CORONARY ANGIOGRAPHY N/A 12/16/2019   Procedure: LEFT HEART CATH AND CORONARY ANGIOGRAPHY;  Surgeon: Nelva Bush, MD;  Location: [facility] CV [facility];  Service: Cardiovascular;  Laterality: N/A;  FAMILY HISTORY:  Family History  Problem Relation Age of Onset  . Cancer Mother   . Lung cancer Father   . Lung cancer Brother   . Emphysema Paternal Grandfather      SOCIAL HISTORY:  reports that he has quit smoking. He has never used smokeless tobacco. He reports that he does not drink alcohol and does not use drugs. The patient is married and lives in [place]. 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    ALLERGIES: Patient has no known allergies.   MEDICATIONS:  Current Outpatient Medications  Medication Sig Dispense Refill  . aspirin 81 MG tablet Take  81 mg by mouth daily.    Marland Kitchen b complex vitamins tablet Take 1 tablet by mouth daily.    . enalapril (VASOTEC) 5 MG tablet Take 2.5 mg by mouth daily.    Marland Kitchen HYDROcodone-acetaminophen (HYCET) 7.5-325 mg/15 ml solution Take 10-15 mLs by mouth every 4 (four) hours as needed for moderate pain. 120 mL 0  . ibuprofen (ADVIL) 600 MG tablet Take 600 mg by mouth every 8 (eight) hours as needed for moderate pain.    . isosorbide mononitrate (IMDUR) 30 MG 24 hr tablet Take 0.5 tablets (15 mg total) by mouth daily. 15 tablet 11  . levothyroxine (SYNTHROID) 112 MCG tablet Take 112 mcg by mouth daily before breakfast.    . magnesium oxide (MAG-OX) 400 (241.3 Mg) MG tablet Take 1 tablet (400 mg total) by mouth 2 (two) times daily. 60 tablet 0  . metoprolol tartrate (LOPRESSOR) 25 MG tablet Take 12.5 mg by mouth at bedtime.    . nitroGLYCERIN (NITROSTAT) 0.4 MG SL tablet Place 1 tablet (0.4 mg total) under the tongue every 5 (five) minutes as needed for chest pain. 25 tablet prn  . Omega-3 Fatty Acids (FISH OIL) 1000 MG CAPS Take 2 capsules (2,000 mg total) by mouth daily. 180 capsule 0  . polyethylene glycol (MIRALAX / GLYCOLAX) packet Take 17 g by mouth daily.    . pravastatin (PRAVACHOL) 80 MG tablet Take 80 mg by mouth at bedtime.     . sodium chloride (OCEAN) 0.65 % SOLN nasal spray Place 1 spray into both nostrils as needed for congestion.    . sucralfate (CARAFATE) 1 g tablet Crush and mix in 1 oz water and take po 5 min before meals for radiation induced esophagitis 120 tablet 2   No current facility-administered medications for this encounter.     REVIEW OF SYSTEMS: On review of systems the patient reports he has been feeling quite well overall.  He continues to do well with immunotherapy and receives this at the [place] in [place].  He has not noticed any significant changes in his breathing, coughing or concerns for new symptoms.  No other complaints are verbalized.    PHYSICAL EXAM:  Unable to assess  due to encounter type.   ECOG = 0  0 - Asymptomatic (Fully active, able to carry on all predisease activities without restriction)  1 - Symptomatic but completely ambulatory (Restricted in physically strenuous activity but ambulatory and able to carry out work of a light or sedentary nature. For example, light housework, office work)  2 - Symptomatic, <50% in bed during the day (Ambulatory and capable of all self care but unable to carry out any work activities. Up and about more than 50% of waking hours)  3 - Symptomatic, >50% in bed, but not bedbound (Capable of only limited self-care, confined to bed or chair  50% or more of waking hours)  4 - Bedbound (Completely disabled. Cannot carry on any self-care. Totally confined to bed or chair)  5 - Death   Eustace Pen MM, Creech RH, Tormey DC, et al. [phone]). "Toxicity and response criteria of the [facility]". [place] Oncol. 5 (6): 649-55    LABORATORY DATA:  Lab Results  Component Value Date   WBC 5.8 05/16/2020   HGB 12.6 (L) 05/16/2020   HCT 38.6 (L) 05/16/2020   MCV 93.5 05/16/2020   PLT 275 05/16/2020   Lab Results  Component Value Date   NA 136 05/16/2020   K 4.2 05/16/2020   CL 100 05/16/2020   CO2 24 05/16/2020   Lab Results  Component Value Date   ALT 43 05/16/2020   AST 39 05/16/2020   ALKPHOS 83 05/16/2020   BILITOT 1.0 05/16/2020      RADIOGRAPHY: No results found.     IMPRESSION/PLAN: 1. 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  Dr. Lisbeth Renshaw discusses the recent changes in his right lower lobe nodule, and reviews that the most likely situation is that this is a new stage I lung cancer in the setting of prior stage III disease of a different lobe.  The patient is interested in proceeding with treatment, and we have been in close discussion with his providers at the [place].  He will continue  his systemic consolidative immunotherapy for his right upper lobe cancer, and would be a good candidate for stereotactic body radiotherapy (SBRT) to the right lower lobe lesion.  We discussed the risks, benefits, short and long-term effects of radiotherapy and the patient is interested in proceeding.  He was given an appointment for simulation on August 29, 2020.  He will sign written consent at that time to proceed.    Given current concerns for patient exposure during the COVID-19 pandemic, this encounter was conducted via telephone.  The patient has provided two factor identification and has given verbal consent for this type of encounter and has been advised to only accept a meeting of this type in a secure network environment. The time spent during this encounter was 45 minutes including preparation, discussion, and coordination of the patient's care. The attendants for this meeting include Dr. Lisbeth Renshaw, Hayden Pedro  and [name].  During the encounter,  Dr. Lisbeth Renshaw, and Hayden Pedro were located at [facility] Radiation Oncology Department.  Juel Burrow Barraclough was located at home.    The above documentation reflects my direct findings during this shared patient visit. Please see the separate note by Dr. Lisbeth Renshaw on this date for the remainder of the patient's plan of care.    Carola Rhine, PAC

## 2020-08-28 ENCOUNTER — Telehealth: Payer: Self-pay | Admitting: Radiation Oncology

## 2020-08-28 NOTE — Telephone Encounter (Signed)
I called the patient to follow up with him about the recent CT findings in his neck with left supraclavicular disease measuring about 2.4 cm. The patient's appt will be cancelled for tomorrow's simulation. We are awaiting his imaging from the [place] of his neck. We will follow back up with the patient once we've had a chance to review his case.

## 2020-08-29 ENCOUNTER — Ambulatory Visit: Payer: No Typology Code available for payment source | Admitting: Radiation Oncology

## 2020-08-29 ENCOUNTER — Other Ambulatory Visit: Payer: Self-pay | Admitting: Radiation Oncology

## 2020-08-29 DIAGNOSIS — C3490 Malignant neoplasm of unspecified part of unspecified bronchus or lung: Secondary | ICD-10-CM

## 2020-08-29 NOTE — Progress Notes (Signed)
This is a [place] pt with prior PET and MRI at the [place] but both are in [facility] from February 2021.  PET in February showed low level left supraclav disease, but positive right sided disease. He had ChemoRT and this included right supraclav, and to a lesser degree the left supraclav nodal region was covered. Now new CT at [facility] in August (getting loaded up) of the neck shows 2.4 cm left supraclav disease. The purpose is to proceed with restaging to see if other areas of progression that would change systemic therapy and goals of radiotherapy.  I spoke with the patient regarding our discussion with Dr. Jeannene Patella and Dr. Lisbeth Renshaw about the role of restaging his disease since there is such concern for progression within the last two months despite chemoRT and consolidative immunotherapy. He his in agreement with repeat MRI brain and PET scan. We will also be in touch once his neck imaging arrives for comparison with his prior treatment plan to determine if there is room for additional radiotherapy to the left neck.      Carola Rhine, PAC

## 2020-08-30 ENCOUNTER — Other Ambulatory Visit: Payer: Self-pay | Admitting: Radiation Oncology

## 2020-08-30 ENCOUNTER — Ambulatory Visit
Admission: RE | Admit: 2020-08-30 | Discharge: 2020-08-30 | Disposition: A | Discharge status: Home / Self Care | Payer: Self-pay | Source: Ambulatory Visit | Attending: Radiation Oncology | Admitting: Radiation Oncology

## 2020-08-30 ENCOUNTER — Telehealth: Payer: Self-pay | Admitting: *Deleted

## 2020-08-30 DIAGNOSIS — C3490 Malignant neoplasm of unspecified part of unspecified bronchus or lung: Secondary | ICD-10-CM

## 2020-08-30 NOTE — Telephone Encounter (Signed)
Called patient to inform of Pet Scan for 08-31-20 - arrival time- 10 am @ [facility], pt. to be NPO- 6 hrs. prior to test, patient to have water only and also to avoid carbs before test, patient to have STAT labs on 08-31-20 @ 2:15 pm @[facility]   and his MRI - arrival time- 3:30 pm @ [facility], spoke with patient and he is aware of these tests

## 2020-08-31 ENCOUNTER — Ambulatory Visit (HOSPITAL_COMMUNITY)
Admission: RE | Admit: 2020-08-31 | Discharge: 2020-08-31 | Disposition: A | Discharge status: Home / Self Care | Payer: No Typology Code available for payment source | Source: Ambulatory Visit | Attending: Radiation Oncology | Admitting: Radiation Oncology

## 2020-08-31 ENCOUNTER — Other Ambulatory Visit: Payer: Self-pay

## 2020-08-31 ENCOUNTER — Other Ambulatory Visit: Payer: Self-pay | Admitting: *Deleted

## 2020-08-31 ENCOUNTER — Ambulatory Visit
Admission: RE | Admit: 2020-08-31 | Discharge: 2020-08-31 | Disposition: A | Discharge status: Home / Self Care | Payer: No Typology Code available for payment source | Source: Ambulatory Visit | Attending: Radiation Oncology | Admitting: Radiation Oncology

## 2020-08-31 DIAGNOSIS — C3490 Malignant neoplasm of unspecified part of unspecified bronchus or lung: Secondary | ICD-10-CM

## 2020-08-31 LAB — GLUCOSE, CAPILLARY: Glucose-Capillary: 118 mg/dL — ABNORMAL HIGH (ref 70–99)

## 2020-08-31 LAB — BUN & CREATININE (CHCC)
BUN: 19 mg/dL (ref 8–23)
Creatinine: 0.78 mg/dL (ref 0.61–1.24)
GFR, Est AFR Am: >60 mL/min (ref >60)
GFR, Estimated: >60 mL/min (ref >60)

## 2020-08-31 MED ORDER — GADOBUTROL 1 MMOL/ML IV SOLN
9.0000 mL | Freq: Once | INTRAVENOUS | Status: AC | PRN
  Administered 2020-08-31: 9 mL via INTRAVENOUS

## 2020-08-31 MED ORDER — FLUDEOXYGLUCOSE F - 18 (FDG) INJECTION
9.9000 | Freq: Once | INTRAVENOUS | Status: AC | PRN
  Administered 2020-08-31: 9.9 via INTRAVENOUS

## 2020-09-04 ENCOUNTER — Other Ambulatory Visit: Payer: Self-pay | Admitting: Radiation Oncology

## 2020-09-04 MED ORDER — OXYCODONE HCL 5 MG PO TABS: 2.5000 mg | ORAL_TABLET | ORAL | 0 refills | Status: DC | PRN

## 2020-09-04 NOTE — Progress Notes (Signed)
I spoke with the patient and his wife today by phone to let them know the PET scan results as well as MRI results of the brain.  I have also been in conversation with Dr. Jeannene Patella at the [facility], unfortunately the location of the supraclavicular disease is truly within a fully treated field of up to 60 Gy of radiotherapy, we were hopeful that this would be cephalad to the region however given this location, radiotherapy would be felt to be possible if causing potential harm with risks of brachial plexopathy or brachial plexus injury.  The other concern is that higher dose therapy thus far has not been beneficial at reducing the presence of the cancer.  Rather Dr. Lisbeth Renshaw discussed with me that we could potentially offer further radiotherapy if the patient became symptomatic at other bony sites however at this time his left shoulder is the only area of concern.  I spent time with him reviewing short and long-acting medication options with narcotic therapy.  He has not exhausted short acting options so I have given him a prescription for oxycodone 5 mg tablets to be taken 1/2-1 full tablet every 4-6 hours as needed for pain.  I let him know that if this was not alleviating his pain, we could potentially add a long-acting regimen but we would need to have several days of utilizing the short acting therapy in order to determine his long acting dose requirements.  We reviewed the side effect profile of the medication and the need to discontinue tramadol and hydrocodone which he previously had prescriptions for.  He is in agreement with this plan.  We also discussed the options of interventional pain management with anesthesia, this would be preferred if he had persistent symptoms despite systemic therapy.  I will contact Dr. Jeannene Patella as well at the [facility] to update him on our conversation, the patient is planning on guardant 360 testing, and hopefully he will have an actionable mutation for targeted therapy versus systemic  chemotherapy.    Carola Rhine, PAC

## 2020-09-07 ENCOUNTER — Encounter (HOSPITAL_COMMUNITY): Admission: RE | Admit: 2020-09-07 | Payer: No Typology Code available for payment source | Source: Ambulatory Visit

## 2020-09-10 ENCOUNTER — Other Ambulatory Visit (HOSPITAL_COMMUNITY): Payer: No Typology Code available for payment source

## 2020-09-11 ENCOUNTER — Telehealth: Payer: Self-pay | Admitting: Radiation Oncology

## 2020-09-11 ENCOUNTER — Encounter: Payer: Self-pay | Admitting: Radiation Oncology

## 2020-09-11 NOTE — Telephone Encounter (Signed)
The patient called and is interested in meeting with an interventional pain management specialist. I told him I highly recommend Dr. Johnny Bridge at [facility] in [place]. I'll send her information to him by MyChart, and to his oncologist at the [facility] who would have to coordinate community care referral.      Carola Rhine, [facility]

## 2020-09-12 ENCOUNTER — Other Ambulatory Visit: Payer: Self-pay | Admitting: Radiation Oncology

## 2020-09-12 ENCOUNTER — Ambulatory Visit
Admission: RE | Admit: 2020-09-12 | Discharge: 2020-09-12 | Disposition: A | Discharge status: Home / Self Care | Payer: Self-pay | Source: Ambulatory Visit | Attending: Radiation Oncology | Admitting: Radiation Oncology

## 2020-09-12 DIAGNOSIS — C3490 Malignant neoplasm of unspecified part of unspecified bronchus or lung: Secondary | ICD-10-CM

## 2020-10-29 DEATH — deceased

## 2020-11-07 ENCOUNTER — Ambulatory Visit: Payer: No Typology Code available for payment source | Admitting: Cardiology

## 2021-04-01 IMAGING — PT NM PET TUM IMG RESTAG (PS) SKULL BASE T - THIGH
1 of 7 series · 1 of 25 positions shown · non-contrast
Comparison: Multiple exams, including outside PET-CT from [facility] dated
02/23/2020

CLINICAL DATA: Subsequent treatment strategy for non-small cell
lung cancer. Prior chemotherapy and radiation therapy. Reportedly
prior examinations have shown supraclavicular malignancy and
possible recent progression.

EXAM:
NUCLEAR MEDICINE PET SKULL BASE TO THIGH
TECHNIQUE: 9.9 mCi F-18 FDG was injected intravenously. Full-ring PET imaging
was performed from the skull base to thigh after the radiotracer. CT
data was obtained and used for attenuation correction and anatomic
localization.
Fasting blood glucose: 118 mg/dl

[Series 4: ct sk_thigh 5.0 b31f · axial · 5.0mm · 0.98mm/px · 1 of 231 slices shown]
[im 231/231  brain]
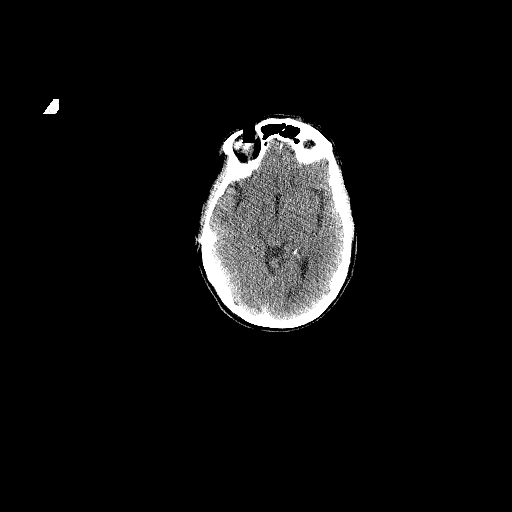

[1 of 25 positions shown; findings below may reference images not displayed]

FINDINGS: Mediastinal blood pool activity: SUV max

Liver activity: SUV max

NECK: Stable mildly accentuated activity along the cerebellar
vermis, probably incidental. Glottic activity is likely physiologic.

Incidental CT findings: Bilateral scleral bands.

CHEST: Hypermetabolic supraclavicular and paratracheal lymph nodes
are present.

For the most part these are substantially improved from prior
PET-CT, for example a previous 1.2 cm right supraclavicular node
with maximum SUV of 11.1 is currently 0.5 cm in diameter on image
47/4 with maximum SUV 5.5. Prior upper right paratracheal node
measuring 1.6 cm in short axis with maximum SUV of 15.7 currently
measures 0.8 cm in diameter on image 53/4 with maximum SUV 4.8.
Moreover, the previous right upper lobe mass in the paramediastinal
position is surrounded by consolidation and is reduced in size and
activity, previously 4.9 by 3.6 cm with maximum SUV of 19.8, and
currently 2.9 by 2.0 cm on image 66/4 with maximum SUV 5.8. There is
also accentuated activity in the surrounding consolidation
compatible with radiation pneumonitis although satellite nodules
cannot be totally excluded.

However, there are some new or progressive findings giving the
response in overall mixed appearance. First of all, there is an
indistinctly marginated left supraclavicular node difficult to
separate from the neurovascular structures but with maximum SUV of
6.6 and with abnormal activity measuring approximately 2.6 by
cm. Previously in this location there was only a subtle focus of
accentuated metabolic activity with maximum SUV of 4.5, but much
smaller. There is also a new soft tissue nodule adjacent to the
right acromion measuring 1.1 by 0.8 cm on image 36/4 with maximum
SUV 10.1, compatible with a small metastatic focus. There also new
osseous metastatic foci in the thorax detailed in the skeletal
section below

In the right lower lobe, a 1.3 cm nodule on image 82/4 is present, I
am uncertain whether this is the same nodule which was more
posteriorly positioned on the prior chest CT, but current maximum
SUV is 4.7, and the previous more posterior nodule was not
appreciably hypermetabolic. Or peripherally along the right lower
lobe, at the pleural margin, there is also a focus of accentuated
metabolic activity measuring about 0.9 by 1.3 cm on image 81/4, with
maximum SUV of 6.5, new compared to the prior exam.

The small right pleural effusion has low-grade accentuated metabolic
activity, generally with maximum SUV around 2.8. Malignant effusion
not excluded.

Incidental CT findings: Emphysema. Coronary, aortic arch, and branch
vessel atherosclerotic vascular disease. New nodularity at the left
lung apex although not appreciably hypermetabolic.

ABDOMEN/PELVIS: No significant abnormal hypermetabolic activity in
this region.

Incidental CT findings: Dependent density in the gallbladder
favoring small gallstone. Aortoiliac atherosclerotic vascular
disease. Prominent stool throughout the colon favors constipation.
Anastomotic staple line in the sigmoid colon.

SKELETON: New osseous metastatic foci in the right scapula,
bilateral ribs, right iliac bone, and left superior pubic ramus are
for the most part occult on the CT data. Index lesion anteriorly in
the right iliac bone has a maximum SUV of 10.0.

Incidental CT findings: Degenerative disc disease at L5-S1.
IMPRESSION: 1. Although the primary tumor and adjacent right upper chest
adenopathy are substantially improved, there is unfortunately
evidence of metastatic spread to the skeleton, to the soft tissues
adjacent to the right acromion, to the left supraclavicular region,
and potentially to the right lower lobe and potentially to the right
pleura.
2. Other imaging findings of potential clinical significance: Aortic
Atherosclerosis ([id number]-6DP.P) and Emphysema ([id number].H). Coronary
atherosclerosis. Cholelithiasis. Prominent stool throughout the
colon favors constipation.
